# Patient Record
Sex: Female | Born: 1940 | Race: White | Hispanic: No | Marital: Married | Smoking: Never smoker
Health system: Southern US, Community
[De-identification: ages and names within clinical notes are randomized; demographics above are authoritative.]

## PROBLEM LIST (undated history)

## (undated) DIAGNOSIS — R071 Chest pain on breathing: Secondary | ICD-10-CM

## (undated) DIAGNOSIS — E785 Hyperlipidemia, unspecified: Secondary | ICD-10-CM

## (undated) DIAGNOSIS — I10 Essential (primary) hypertension: Secondary | ICD-10-CM

## (undated) DIAGNOSIS — N951 Menopausal and female climacteric states: Secondary | ICD-10-CM

## (undated) HISTORY — PX: NO PAST SURGERIES: SHX2092

## (undated) HISTORY — DX: Menopausal and female climacteric states: N95.1

## (undated) HISTORY — DX: Essential (primary) hypertension: I10

## (undated) HISTORY — DX: Chest pain on breathing: R07.1

## (undated) HISTORY — PX: COLONOSCOPY: SHX174

## (undated) HISTORY — DX: Hyperlipidemia, unspecified: E78.5

---

## 1998-08-15 ENCOUNTER — Encounter: Payer: Self-pay | Admitting: Family Medicine

## 1998-08-15 ENCOUNTER — Ambulatory Visit (HOSPITAL_COMMUNITY): Admission: RE | Admit: 1998-08-15 | Discharge: 1998-08-15 | Payer: Self-pay | Admitting: Family Medicine

## 1998-12-21 ENCOUNTER — Ambulatory Visit (HOSPITAL_COMMUNITY): Admission: RE | Admit: 1998-12-21 | Discharge: 1998-12-21 | Payer: Self-pay | Admitting: Family Medicine

## 2000-04-20 ENCOUNTER — Other Ambulatory Visit: Admission: RE | Admit: 2000-04-20 | Discharge: 2000-04-20 | Payer: Self-pay | Admitting: Family Medicine

## 2000-10-28 ENCOUNTER — Encounter: Payer: Self-pay | Admitting: Family Medicine

## 2000-10-28 ENCOUNTER — Ambulatory Visit (HOSPITAL_COMMUNITY): Admission: RE | Admit: 2000-10-28 | Discharge: 2000-10-28 | Payer: Self-pay | Admitting: Family Medicine

## 2001-01-03 ENCOUNTER — Emergency Department (HOSPITAL_COMMUNITY): Admission: EM | Admit: 2001-01-03 | Discharge: 2001-01-03 | Payer: Self-pay

## 2001-10-01 ENCOUNTER — Other Ambulatory Visit: Admission: RE | Admit: 2001-10-01 | Discharge: 2001-10-01 | Payer: Self-pay | Admitting: Family Medicine

## 2001-10-05 ENCOUNTER — Encounter: Payer: Self-pay | Admitting: Internal Medicine

## 2001-11-23 ENCOUNTER — Other Ambulatory Visit: Admission: RE | Admit: 2001-11-23 | Discharge: 2001-11-23 | Payer: Self-pay | Admitting: Family Medicine

## 2003-06-29 ENCOUNTER — Other Ambulatory Visit: Admission: RE | Admit: 2003-06-29 | Discharge: 2003-06-29 | Payer: Self-pay | Admitting: Family Medicine

## 2003-07-05 ENCOUNTER — Encounter: Payer: Self-pay | Admitting: Family Medicine

## 2003-07-05 ENCOUNTER — Encounter: Admission: RE | Admit: 2003-07-05 | Discharge: 2003-07-05 | Payer: Self-pay | Admitting: Family Medicine

## 2004-07-09 ENCOUNTER — Encounter: Payer: Self-pay | Admitting: Internal Medicine

## 2004-07-09 ENCOUNTER — Other Ambulatory Visit: Admission: RE | Admit: 2004-07-09 | Discharge: 2004-07-09 | Payer: Self-pay | Admitting: Family Medicine

## 2005-01-07 ENCOUNTER — Ambulatory Visit (HOSPITAL_COMMUNITY): Admission: RE | Admit: 2005-01-07 | Discharge: 2005-01-07 | Payer: Self-pay | Admitting: Family Medicine

## 2006-01-13 ENCOUNTER — Ambulatory Visit (HOSPITAL_COMMUNITY): Admission: RE | Admit: 2006-01-13 | Discharge: 2006-01-13 | Payer: Self-pay | Admitting: Internal Medicine

## 2006-01-13 ENCOUNTER — Encounter: Payer: Self-pay | Admitting: Internal Medicine

## 2006-02-10 ENCOUNTER — Ambulatory Visit: Payer: Self-pay | Admitting: Internal Medicine

## 2006-02-16 ENCOUNTER — Encounter: Payer: Self-pay | Admitting: Internal Medicine

## 2006-02-17 ENCOUNTER — Ambulatory Visit: Payer: Self-pay | Admitting: Internal Medicine

## 2006-03-17 ENCOUNTER — Ambulatory Visit: Payer: Self-pay | Admitting: Internal Medicine

## 2006-04-01 ENCOUNTER — Ambulatory Visit: Payer: Self-pay | Admitting: Internal Medicine

## 2006-04-06 ENCOUNTER — Ambulatory Visit: Payer: Self-pay | Admitting: Internal Medicine

## 2007-01-15 ENCOUNTER — Encounter: Payer: Self-pay | Admitting: Internal Medicine

## 2007-01-15 ENCOUNTER — Ambulatory Visit (HOSPITAL_COMMUNITY): Admission: RE | Admit: 2007-01-15 | Discharge: 2007-01-15 | Payer: Self-pay | Admitting: Internal Medicine

## 2007-02-15 ENCOUNTER — Ambulatory Visit: Payer: Self-pay | Admitting: Internal Medicine

## 2007-02-15 LAB — CONVERTED CEMR LAB
ALT: 17 units/L (ref 0–40)
BUN: 16 mg/dL (ref 6–23)
Bilirubin, Direct: 0.1 mg/dL (ref 0.0–0.3)
CO2: 26 meq/L (ref 19–32)
Chloride: 109 meq/L (ref 96–112)
Creatinine, Ser: 0.8 mg/dL (ref 0.4–1.2)
Direct LDL: 159.1 mg/dL
Eosinophils Absolute: 0.1 10*3/uL (ref 0.0–0.6)
GFR calc Af Amer: 92 mL/min
GFR calc non Af Amer: 76 mL/min
HCT: 43.8 % (ref 36.0–46.0)
HDL: 44.9 mg/dL (ref >39.0)
Hemoglobin: 14.8 g/dL (ref 12.0–15.0)
MCHC: 33.8 g/dL (ref 30.0–36.0)
MCV: 91.9 fL (ref 78.0–100.0)
Neutro Abs: 3.3 10*3/uL (ref 1.4–7.7)
Neutrophils Relative %: 57.6 % (ref 43.0–77.0)
Platelets: 267 10*3/uL (ref 150–400)
Potassium: 4.3 meq/L (ref 3.5–5.1)
RDW: 12.3 % (ref 11.5–14.6)
Sodium: 142 meq/L (ref 135–145)
TSH: 1.39 microintl units/mL (ref 0.35–5.50)
Total CHOL/HDL Ratio: 5.2
Total Protein: 7.1 g/dL (ref 6.0–8.3)
Triglycerides: 160 mg/dL — ABNORMAL HIGH (ref 0–149)
WBC: 5.6 10*3/uL (ref 4.5–10.5)

## 2007-02-19 ENCOUNTER — Ambulatory Visit: Payer: Self-pay | Admitting: Internal Medicine

## 2007-03-24 ENCOUNTER — Ambulatory Visit: Payer: Self-pay | Admitting: Internal Medicine

## 2007-07-28 DIAGNOSIS — I10 Essential (primary) hypertension: Secondary | ICD-10-CM

## 2007-07-28 HISTORY — DX: Essential (primary) hypertension: I10

## 2007-08-16 ENCOUNTER — Ambulatory Visit: Payer: Self-pay | Admitting: Internal Medicine

## 2007-08-27 ENCOUNTER — Ambulatory Visit: Payer: Self-pay | Admitting: Internal Medicine

## 2007-12-12 ENCOUNTER — Emergency Department (HOSPITAL_COMMUNITY): Admission: EM | Admit: 2007-12-12 | Discharge: 2007-12-12 | Payer: Self-pay | Admitting: Emergency Medicine

## 2007-12-13 ENCOUNTER — Ambulatory Visit: Payer: Self-pay | Admitting: Internal Medicine

## 2007-12-13 DIAGNOSIS — H6500 Acute serous otitis media, unspecified ear: Secondary | ICD-10-CM | POA: Insufficient documentation

## 2008-02-04 ENCOUNTER — Ambulatory Visit (HOSPITAL_COMMUNITY): Admission: RE | Admit: 2008-02-04 | Discharge: 2008-02-04 | Payer: Self-pay | Admitting: Internal Medicine

## 2008-02-23 ENCOUNTER — Telehealth: Payer: Self-pay | Admitting: Internal Medicine

## 2008-03-13 ENCOUNTER — Ambulatory Visit: Payer: Self-pay | Admitting: Internal Medicine

## 2008-05-25 ENCOUNTER — Ambulatory Visit: Payer: Self-pay | Admitting: Internal Medicine

## 2008-05-25 LAB — CONVERTED CEMR LAB
AST: 24 units/L (ref 0–37)
Albumin: 4.1 g/dL (ref 3.5–5.2)
Basophils Absolute: 0 10*3/uL (ref 0.0–0.1)
Basophils Relative: 0.1 % (ref 0.0–1.0)
Bilirubin, Direct: 0.1 mg/dL (ref 0.0–0.3)
CO2: 29 meq/L (ref 19–32)
Calcium: 10.1 mg/dL (ref 8.4–10.5)
Chloride: 104 meq/L (ref 96–112)
Cholesterol: 226 mg/dL (ref 0–200)
Creatinine, Ser: 0.8 mg/dL (ref 0.4–1.2)
GFR calc non Af Amer: 76 mL/min
Glucose, Bld: 89 mg/dL (ref 70–99)
Glucose, Urine, Semiquant: NEGATIVE
Lymphocytes Relative: 26.4 % (ref 12.0–46.0)
MCHC: 35.6 g/dL (ref 30.0–36.0)
MCV: 92.8 fL (ref 78.0–100.0)
Platelets: 245 10*3/uL (ref 150–400)
Protein, U semiquant: NEGATIVE
Sodium: 141 meq/L (ref 135–145)
TSH: 1.18 microintl units/mL (ref 0.35–5.50)
Total Bilirubin: 1.1 mg/dL (ref 0.3–1.2)
Total Protein: 7.7 g/dL (ref 6.0–8.3)
pH: 6

## 2008-05-31 ENCOUNTER — Encounter: Payer: Self-pay | Admitting: Internal Medicine

## 2008-06-01 ENCOUNTER — Ambulatory Visit: Payer: Self-pay | Admitting: Internal Medicine

## 2008-06-01 ENCOUNTER — Encounter: Payer: Self-pay | Admitting: Internal Medicine

## 2008-06-01 ENCOUNTER — Other Ambulatory Visit: Admission: RE | Admit: 2008-06-01 | Discharge: 2008-06-01 | Payer: Self-pay | Admitting: Internal Medicine

## 2008-06-01 DIAGNOSIS — N951 Menopausal and female climacteric states: Secondary | ICD-10-CM

## 2008-06-01 DIAGNOSIS — E785 Hyperlipidemia, unspecified: Secondary | ICD-10-CM

## 2008-06-01 DIAGNOSIS — E782 Mixed hyperlipidemia: Secondary | ICD-10-CM

## 2008-06-01 HISTORY — DX: Hyperlipidemia, unspecified: E78.5

## 2008-06-01 HISTORY — DX: Menopausal and female climacteric states: N95.1

## 2008-07-26 ENCOUNTER — Ambulatory Visit: Payer: Self-pay | Admitting: Internal Medicine

## 2008-07-26 DIAGNOSIS — R071 Chest pain on breathing: Secondary | ICD-10-CM

## 2008-07-26 HISTORY — DX: Chest pain on breathing: R07.1

## 2008-09-18 ENCOUNTER — Telehealth: Payer: Self-pay | Admitting: Internal Medicine

## 2009-03-22 ENCOUNTER — Ambulatory Visit (HOSPITAL_COMMUNITY): Admission: RE | Admit: 2009-03-22 | Discharge: 2009-03-22 | Payer: Self-pay | Admitting: Internal Medicine

## 2009-07-02 ENCOUNTER — Ambulatory Visit: Payer: Self-pay | Admitting: Internal Medicine

## 2009-07-02 LAB — CONVERTED CEMR LAB
ALT: 27 units/L (ref 0–35)
Alkaline Phosphatase: 74 units/L (ref 39–117)
BUN: 16 mg/dL (ref 6–23)
Basophils Absolute: 0 10*3/uL (ref 0.0–0.1)
Bilirubin Urine: NEGATIVE
Bilirubin, Direct: 0.2 mg/dL (ref 0.0–0.3)
Chloride: 107 meq/L (ref 96–112)
Cholesterol: 220 mg/dL — ABNORMAL HIGH (ref 0–200)
Creatinine, Ser: 0.8 mg/dL (ref 0.4–1.2)
Direct LDL: 150.9 mg/dL
GFR calc non Af Amer: 75.59 mL/min (ref >60)
Glucose, Bld: 93 mg/dL (ref 70–99)
Hemoglobin: 15 g/dL (ref 12.0–15.0)
Lymphocytes Relative: 35.4 % (ref 12.0–46.0)
MCHC: 33.9 g/dL (ref 30.0–36.0)
Monocytes Absolute: 0.7 10*3/uL (ref 0.1–1.0)
Neutrophils Relative %: 53.2 % (ref 43.0–77.0)
Platelets: 232 10*3/uL (ref 150.0–400.0)
RBC: 4.77 M/uL (ref 3.87–5.11)
Total CHOL/HDL Ratio: 5
Total Protein, Urine: NEGATIVE mg/dL
Triglycerides: 176 mg/dL — ABNORMAL HIGH (ref 0.0–149.0)
Urine Glucose: NEGATIVE mg/dL
Urobilinogen, UA: 0.2 (ref 0.0–1.0)
VLDL: 35.2 mg/dL (ref 0.0–40.0)
pH: 6 (ref 5.0–8.0)

## 2009-07-06 ENCOUNTER — Ambulatory Visit: Payer: Self-pay | Admitting: Internal Medicine

## 2010-04-02 ENCOUNTER — Ambulatory Visit: Payer: Self-pay | Admitting: Internal Medicine

## 2010-04-02 DIAGNOSIS — IMO0002 Reserved for concepts with insufficient information to code with codable children: Secondary | ICD-10-CM

## 2010-04-05 ENCOUNTER — Ambulatory Visit (HOSPITAL_COMMUNITY): Admission: RE | Admit: 2010-04-05 | Discharge: 2010-04-05 | Payer: Self-pay | Admitting: Internal Medicine

## 2010-04-05 LAB — HM MAMMOGRAPHY: HM Mammogram: NEGATIVE

## 2010-07-08 ENCOUNTER — Ambulatory Visit: Payer: Self-pay | Admitting: Internal Medicine

## 2010-07-08 LAB — CONVERTED CEMR LAB
ALT: 32 units/L (ref 0–35)
BUN: 17 mg/dL (ref 6–23)
Basophils Absolute: 0 10*3/uL (ref 0.0–0.1)
Bilirubin Urine: NEGATIVE
Bilirubin, Direct: 0.1 mg/dL (ref 0.0–0.3)
CO2: 25 meq/L (ref 19–32)
Calcium: 10.2 mg/dL (ref 8.4–10.5)
Cholesterol: 220 mg/dL — ABNORMAL HIGH (ref 0–200)
Creatinine, Ser: 0.8 mg/dL (ref 0.4–1.2)
Direct LDL: 139.9 mg/dL
Eosinophils Relative: 1.6 % (ref 0.0–5.0)
GFR calc non Af Amer: 74.29 mL/min (ref >60)
HCT: 43.3 % (ref 36.0–46.0)
Hemoglobin: 15 g/dL (ref 12.0–15.0)
Lymphocytes Relative: 28.1 % (ref 12.0–46.0)
MCHC: 34.7 g/dL (ref 30.0–36.0)
Neutro Abs: 4.1 10*3/uL (ref 1.4–7.7)
Nitrite: NEGATIVE
Potassium: 3.8 meq/L (ref 3.5–5.1)
Protein, U semiquant: NEGATIVE
RBC: 4.59 M/uL (ref 3.87–5.11)
RDW: 13.1 % (ref 11.5–14.6)
TSH: 1.34 microintl units/mL (ref 0.35–5.50)
Total Bilirubin: 0.7 mg/dL (ref 0.3–1.2)
Total Protein: 6.9 g/dL (ref 6.0–8.3)
Triglycerides: 245 mg/dL — ABNORMAL HIGH (ref 0.0–149.0)
Urobilinogen, UA: 0.2
WBC: 6.8 10*3/uL (ref 4.5–10.5)
pH: 7.5

## 2010-07-11 ENCOUNTER — Ambulatory Visit: Payer: Self-pay | Admitting: Internal Medicine

## 2010-07-12 ENCOUNTER — Encounter: Payer: Self-pay | Admitting: Internal Medicine

## 2010-07-12 ENCOUNTER — Ambulatory Visit: Payer: Self-pay | Admitting: Internal Medicine

## 2010-11-01 ENCOUNTER — Ambulatory Visit: Payer: Self-pay | Admitting: Internal Medicine

## 2010-11-01 DIAGNOSIS — M7500 Adhesive capsulitis of unspecified shoulder: Secondary | ICD-10-CM

## 2010-11-05 ENCOUNTER — Encounter: Payer: Self-pay | Admitting: Internal Medicine

## 2010-12-19 NOTE — Miscellaneous (Signed)
Summary: Waiver of Liability for [employer]  Waiver of Liability for [employer]   Imported By: Maryln Gottron 07/17/2010 11:26:00  _____________________________________________________________________  External Attachment:    Type:   Image     Comment:   External Document

## 2010-12-19 NOTE — Assessment & Plan Note (Signed)
Summary: consult re: leg pain for approx 3 wks/cjr   Vital Signs:  Patient profile:   70 year old female Weight:      144 pounds Temp:     98.0 degrees F oral BP sitting:   108 / 60  (left arm) Cuff size:   regular  Vitals Entered By: Duard Brady LPN (Apr 02, 2010 11:28 AM) CC: c/o (R) groin pain x3 wks - started water aerobics 3 wks ago Is Patient Diabetic? No   CC:  c/o (R) groin pain x3 wks - started water aerobics 3 wks ago.  History of Present Illness: 70 year old patient who presents with a 3-week history of pain in the right groin area.  She has been quite active with golf exercise regimen and water aerobics while she was at the beach.  Pain is aggravated by certain movements that involved external rotation of the hip.  There's been no history of trauma.  There is been no local tenderness or concerns about a hernia  Preventive Screening-Counseling & Management  Alcohol-Tobacco     Smoking Status: never  Allergies (verified): No Known Drug Allergies  Past History:  Past Medical History: Reviewed history from 06/01/2008 and no changes required. Hypertension gravida 3, para 3, abortus zero Hyperlipidemia menopausal syndrome  Physical Exam  General:  Well-developed,well-nourished,in no acute distress; alert,appropriate and cooperative throughout examination; low-normal blood pressure Msk:  no tenderness to palpation in the right groin region.  Straight leg testing was negative.  Flexion of the hip revealed no discomfort, but external rotation reproduced her pain   Impression & Recommendations:  Problem # 1:  GROIN STRAIN, RIGHT (ICD-848.8)  Problem # 2:  HYPERTENSION (ICD-401.9)  Her updated medication list for this problem includes:    Hydrochlorothiazide 25 Mg Tabs (Hydrochlorothiazide) .Marland Kitchen... Take 1 tablet by mouth once a day  Her updated medication list for this problem includes:    Hydrochlorothiazide 25 Mg Tabs (Hydrochlorothiazide) .Marland Kitchen... Take 1  tablet by mouth once a day  Complete Medication List: 1)  Aspirin 81 Mg Tbec (Aspirin) .... Take 1 tablet by mouth every morning 2)  Hydrochlorothiazide 25 Mg Tabs (Hydrochlorothiazide) .... Take 1 tablet by mouth once a day 3)  Multivitamins Tabs (Multiple vitamin) .... Once daily 4)  Caltrate 600+d 600-400 Mg-unit Tabs (Calcium carbonate-vitamin d) .... Qd  Patient Instructions: 1)  You may move around but avoid painful motions. Apply ice to sore area for 20 minutes 3-4 times a day for 2-3 days. 2)  Take 400-600mg  of Ibuprofen (Advil, Motrin) with food every 4-6 hours as needed for relief of pain or comfort of fever.

## 2010-12-19 NOTE — Miscellaneous (Signed)
Summary: BONE DENSITY  Clinical Lists Changes  Orders: Added new Test order of T-Bone Densitometry (77080) - Signed Added new Test order of T-Lumbar Vertebral Assessment (77082) - Signed 

## 2010-12-19 NOTE — Assessment & Plan Note (Signed)
Summary: CPX // RS---PT [facility] // RS   Vital Signs:  Patient profile:   70 year old female Menstrual status:  postmenopausal Height:      62.75 inches Weight:      124 pounds BMI:     22.22 Temp:     98.1 degrees F oral BP sitting:   120 / 90  (left arm) Cuff size:   regular  Vitals Entered By: Kathrynn Speed CMA (July 11, 2010 1:44 PM) CC: cpx, review labs,src Is Patient Diabetic? No     Menstrual Status postmenopausal   CC:  cpx, review labs, and src.  History of Present Illness: 57 -year-old who is in today for a wellness exam.  She has a history of treated hypertension.  Complains include right hip and right shoulder pain.  She denies any cardiopulmonary complaints.  She remains quite active with walking and golf, but at times uncomfortable  Here for Medicare AWV:  1.   Risk factors based on Past M, S, F history:cardiovascular risk factors include a history of hypertension 2.   Physical Activities: remains quite active with walking daily and also a golf 3.   Depression/mood: no history of depression or mood disorder 4.   Hearing: no deficits 5.   ADL's: independent in all aspects of daily living 6.   Fall Risk: low 7.   Home Safety: no problems identified 8.   Height, weight, &visual acuity:height and weight stable.  No prior bone density study.  No difficulty with visual acuity 9.   Counseling: heart healthy diet.  Discussed 10.   Labs ordered based on risk factors: laboratory studies will be reviewed, including lipid profile 11.           Referral Coordination- may require orthopedic referral if symptoms refractory to short-term anti-inflammatory medication 12.           Care Plan- bone density study ordered 13.            Cognitive Assessment- alert and oriented, with normal affect and memory dysfunction, able to perform all executive functions.  No functional limitations   Current Medications (verified): 1)  Aspirin 81 Mg Tbec (Aspirin) .... Take 1 Tablet By Mouth  Every Morning 2)  Hydrochlorothiazide 25 Mg Tabs (Hydrochlorothiazide) .... Take 1 Tablet By Mouth Once A Day 3)  Multivitamins   Tabs (Multiple Vitamin) .... Once Daily 4)  Caltrate 600+d 600-400 Mg-Unit Tabs (Calcium Carbonate-Vitamin D) .... Qd  Allergies (verified): No Known Drug Allergies  Past History:  Past Medical History: Reviewed history from 06/01/2008 and no changes required. Hypertension gravida 3, para 3, abortus zero Hyperlipidemia menopausal syndrome  Past Surgical History: Reviewed history from 03/13/2008 and no changes required. colonoscopy  May 2007  Family History: Reviewed history from 07/06/2009 and no changes required. father died age 22, heart failure mother died age 13, lung cancer one brother is well    Social History: Reviewed history from 06/01/2008 and no changes required. Married Never Smoked Regular exercise-yes  Review of Systems  The patient denies anorexia, fever, weight loss, weight gain, vision loss, decreased hearing, hoarseness, chest pain, syncope, dyspnea on exertion, peripheral edema, prolonged cough, headaches, hemoptysis, abdominal pain, melena, hematochezia, severe indigestion/heartburn, hematuria, incontinence, genital sores, muscle weakness, suspicious skin lesions, transient blindness, difficulty walking, depression, unusual weight change, abnormal bleeding, enlarged lymph nodes, angioedema, and breast masses.    Physical Exam  General:  Well-developed,well-nourished,in no acute distress; alert,appropriate and cooperative throughout examination Head:  Normocephalic and atraumatic without  obvious abnormalities. No apparent alopecia or balding. Eyes:  No corneal or conjunctival inflammation noted. EOMI. Perrla. Funduscopic exam benign, without hemorrhages, exudates or papilledema. Vision grossly normal. Ears:  External ear exam shows no significant lesions or deformities.  Otoscopic examination reveals clear canals, tympanic  membranes are intact bilaterally without bulging, retraction, inflammation or discharge. Hearing is grossly normal bilaterally. Nose:  External nasal examination shows no deformity or inflammation. Nasal mucosa are pink and moist without lesions or exudates. Mouth:  Oral mucosa and oropharynx without lesions or exudates.  Teeth in good repair. Neck:  No deformities, masses, or tenderness noted. Chest Wall:  No deformities, masses, or tenderness noted. Breasts:  No mass, nodules, thickening, tenderness, bulging, retraction, inflamation, nipple discharge or skin changes noted.   Lungs:  Normal respiratory effort, chest expands symmetrically. Lungs are clear to auscultation, no crackles or wheezes. Heart:  Normal rate and regular rhythm. S1 and S2 normal without gallop, murmur, click, rub or other extra sounds. Abdomen:  Bowel sounds positive,abdomen soft and non-tender without masses, organomegaly or hernias noted. Rectal:  No external abnormalities noted. Normal sphincter tone. No rectal masses or tenderness. Genitalia:  Normal introitus for age, no external lesions, no vaginal discharge, mucosa pink and moist, no vaginal or cervical lesions, no vaginal atrophy, no friaility or hemorrhage, normal uterus size and position, no adnexal masses or tenderness Msk:  No deformity or scoliosis noted of thoracic or lumbar spine.   Pulses:  R and L carotid,radial,femoral,dorsalis pedis and posterior tibial pulses are full and equal bilaterally Extremities:  No clubbing, cyanosis, edema, or deformity noted with normal full range of motion of all joints.   Neurologic:  No cranial nerve deficits noted. Station and gait are normal. Plantar reflexes are down-going bilaterally. DTRs are symmetrical throughout. Sensory, motor and coordinative functions appear intact. Skin:  Intact without suspicious lesions or rashes Cervical Nodes:  No lymphadenopathy noted Axillary Nodes:  No palpable lymphadenopathy Inguinal  Nodes:  No significant adenopathy Psych:  Cognition and judgment appear intact. Alert and cooperative with normal attention span and concentration. No apparent delusions, illusions, hallucinations   Impression & Recommendations:  Problem # 1:  PHYSICAL EXAMINATION (ICD-V70.0)  Orders: Medicare -1st Annual Wellness Visit [phone])  Complete Medication List: 1)  Aspirin 81 Mg Tbec (Aspirin) .... Take 1 tablet by mouth every morning 2)  Hydrochlorothiazide 25 Mg Tabs (Hydrochlorothiazide) .... Take 1 tablet by mouth once a day 3)  Multivitamins Tabs (Multiple vitamin) .... Once daily 4)  Caltrate 600+d 600-400 Mg-unit Tabs (Calcium carbonate-vitamin d) .... Qd  Other Orders: Zoster (Shingles) Vaccine Live [phone]) Admin 1st Vaccine (69629) Flu Vaccine 81yrs + (52841)  Patient Instructions: 1)  Please schedule a follow-up appointment in 1 year. 2)  Limit your Sodium (Salt) to less than 2 grams a day(slightly less than 1/2 a teaspoon) to prevent fluid retention, swelling, or worsening of symptoms. 3)  It is important that you exercise regularly at least 20 minutes 5 times a week. If you develop chest pain, have severe difficulty breathing, or feel very tired , stop exercising immediately and seek medical attention. 4)  Take calcium +Vitamin D daily. 5)  Take 400-600mg  of Ibuprofen (Advil, Motrin) with food every 4-6 hours as needed for relief of pain or comfort of fever. 6)  Schedule Bone Density Prescriptions: HYDROCHLOROTHIAZIDE 25 MG TABS (HYDROCHLOROTHIAZIDE) Take 1 tablet by mouth once a day  #90 x 6   Entered and Authorized by:   Gordy Savers  MD  Signed by:   Gordy Savers  MD on 07/11/2010   Method used:   Electronically to        [facility]  [phone]* (retail)       [address] [place], [place]  [address]       Ph: [phone] or [phone]       Fax: [phone]   RxID:   [id number]    Immunizations Administered:  Zostavax # 1:     Vaccine Type: Zostavax    Site: left deltoid    Mfr: Merck    Dose: 0.5 ml    Route: Rice    Given by: Duard Brady LPN    Exp. Date: 07/05/2011    Lot #: [id number]    VIS given: 08/29/05 given July 11, 2010.    Physician counseled: yes Flu Vaccine Consent Questions     Do you have a history of severe allergic reactions to this vaccine? no    Any prior history of allergic reactions to egg and/or gelatin? no    Do you have a sensitivity to the preservative Thimersol? no    Do you have a past history of Guillan-Barre Syndrome? no    Do you currently have an acute febrile illness? no    Have you ever had a severe reaction to latex? no    Vaccine information given and explained to patient? yes    Are you currently pregnant? no    Lot Number:[id number]   Exp Date:05/17/2011   Site Given  Left Deltoid IM    VIS given: 08/29/05 given July 11, 2010.    Physician counseled: yes .lbflu

## 2010-12-19 NOTE — Assessment & Plan Note (Signed)
Summary: arm pain/njr   Vital Signs:  Patient profile:   70 year old female Menstrual status:  postmenopausal Weight:      146 pounds Temp:     98.1 degrees F oral BP sitting:   110 / 80  (left arm) Cuff size:   regular  Vitals Entered By: Duard Brady LPN (November 01, 2010 8:10 AM) CC: rt arm and leg pain Is Patient Diabetic? No   CC:  rt arm and leg pain.  History of Present Illness:  70 year old patient, who presents with a two to 3 month history of  worsening right shoulder pain.  The pain is aggravated by movement.  She is also having some right hip discomfort.  is a difficult time reaching the behind her back and raising her right arm past 90 degrees abduction.  No trauma.  She has a history of hypertension, which has been well controlled on diuretic therapy;  remains quite active with exercise program  Allergies (verified): No Known Drug Allergies  Past History:  Past Medical History: Reviewed history from 06/01/2008 and no changes required. Hypertension gravida 3, para 3, abortus zero Hyperlipidemia menopausal syndrome  Review of Systems       The patient complains of difficulty walking.  The patient denies anorexia, fever, weight loss, weight gain, vision loss, decreased hearing, hoarseness, chest pain, syncope, dyspnea on exertion, peripheral edema, prolonged cough, headaches, hemoptysis, abdominal pain, melena, hematochezia, severe indigestion/heartburn, hematuria, incontinence, genital sores, muscle weakness, suspicious skin lesions, transient blindness, depression, unusual weight change, abnormal bleeding, enlarged lymph nodes, angioedema, and breast masses.    Physical Exam  General:  Well-developed,well-nourished,in no acute distress; alert,appropriate and cooperative throughout examination; low-pressure well-controlled Msk:  range of motion.  The right shoulder, limited, and painful; internal and external rotation of the right  hip also  painful   Impression & Recommendations:  Problem # 1:  ADHESIVE CAPSULITIS, RIGHT (ICD-726.0)  Orders: Orthopedic Referral (Ortho)  Problem # 2:  HYPERTENSION (ICD-401.9)  Her updated medication list for this problem includes:    Hydrochlorothiazide 25 Mg Tabs (Hydrochlorothiazide) .Marland Kitchen... Take 1 tablet by mouth once a day  Her updated medication list for this problem includes:    Hydrochlorothiazide 25 Mg Tabs (Hydrochlorothiazide) .Marland Kitchen... Take 1 tablet by mouth once a day  Complete Medication List: 1)  Aspirin 81 Mg Tbec (Aspirin) .... Take 1 tablet by mouth every morning 2)  Hydrochlorothiazide 25 Mg Tabs (Hydrochlorothiazide) .... Take 1 tablet by mouth once a day 3)  Multivitamins Tabs (Multiple vitamin) .... Once daily 4)  Caltrate 600+d 600-400 Mg-unit Tabs (Calcium carbonate-vitamin d) .... Qd  Patient Instructions: 1)  Limit your Sodium (Salt) to less than 2 grams a day(slightly less than 1/2 a teaspoon) to prevent fluid retention, swelling, or worsening of symptoms. 2)  It is important that you exercise regularly at least 20 minutes 5 times a week. If you develop chest pain, have severe difficulty breathing, or feel very tired , stop exercising immediately and seek medical attention. 3)  orthopedic evaluation as scheduled 4)  annual  exam in August   Orders Added: 1)  Est. Patient Level III [57846] 2)  Orthopedic Referral [Ortho]

## 2010-12-19 NOTE — Consult Note (Signed)
Summary: [facility]   Imported By: Maryln Gottron 11/19/2010 08:51:28  _____________________________________________________________________  External Attachment:    Type:   Image     Comment:   External Document

## 2011-04-04 NOTE — Assessment & Plan Note (Signed)
[facility] OFFICE NOTE   Kayla [name], Kayla [name]                       MRN:          [id number]  DATE:02/19/2007                            DOB:          [birth year]    A 70 year old female seen today for an annual exam.  She enjoys  excellent health.  She has had some borderline cholesterol readings.  She is also a hypertensive suspect.  She has not been checking her home  blood pressures recently.  She takes no chronic medications.  She had a  recent mammogram and last year had colonoscopy.   FAMILY HISTORY:  Unchanged, 1 brother remains well.   EXAM:  Reveals a well-developed healthy-appearing female who appears  younger than her stated age.  Blood pressure is 140/100.  Fundi, ear, nose, and throat clear.  NECK:  No bruits or adenopathy.  No thyroid enlargement.  CHEST:  Clear.  BREASTS:  Negative.  CARDIOVASCULAR:  Normal heart sounds, no murmurs.  ABDOMEN:  Benign.  Mildly overweight.  PELVIC:  Normal cervix.  No adnexal masses.  RECTAL:  Deferred.  EXTREMITIES:  Full peripheral pulses.  No edema.  NEURO:  Negative.   IMPRESSION:  Rule out hypertension.   DISPOSITION:  Will ask the patient to track her blood pressures  carefully over the next couple weeks and consider antihypertensive  therapy at that time.  Will continue her efforts at exercise, diet, and  weight loss.     Gordy Savers, MD  Electronically Signed    PFK/MedQ  DD: 02/19/2007  DT: 02/19/2007  Job #: [id number]

## 2011-04-28 ENCOUNTER — Other Ambulatory Visit: Payer: Self-pay | Admitting: Internal Medicine

## 2011-04-28 DIAGNOSIS — Z1231 Encounter for screening mammogram for malignant neoplasm of breast: Secondary | ICD-10-CM

## 2011-05-05 ENCOUNTER — Ambulatory Visit (HOSPITAL_COMMUNITY)
Admission: RE | Admit: 2011-05-05 | Discharge: 2011-05-05 | Disposition: A | Discharge status: Home / Self Care | Payer: Medicare Other | Source: Ambulatory Visit | Attending: Internal Medicine | Admitting: Internal Medicine

## 2011-05-05 DIAGNOSIS — Z1231 Encounter for screening mammogram for malignant neoplasm of breast: Secondary | ICD-10-CM | POA: Insufficient documentation

## 2011-07-14 ENCOUNTER — Other Ambulatory Visit: Payer: Self-pay | Admitting: Internal Medicine

## 2011-07-23 ENCOUNTER — Encounter: Payer: Self-pay | Admitting: Internal Medicine

## 2011-07-24 ENCOUNTER — Telehealth: Payer: Self-pay | Admitting: Internal Medicine

## 2011-07-24 ENCOUNTER — Ambulatory Visit (INDEPENDENT_AMBULATORY_CARE_PROVIDER_SITE_OTHER): Payer: Medicare Other | Admitting: Internal Medicine

## 2011-07-24 ENCOUNTER — Encounter: Payer: Self-pay | Admitting: Internal Medicine

## 2011-07-24 VITALS — BP 112/80 | HR 64 | Temp 97.4°F | Resp 16 | Ht 63.0 in | Wt 146.0 lb

## 2011-07-24 DIAGNOSIS — Z23 Encounter for immunization: Secondary | ICD-10-CM

## 2011-07-24 DIAGNOSIS — I10 Essential (primary) hypertension: Secondary | ICD-10-CM

## 2011-07-24 DIAGNOSIS — Z0000 Encounter for general adult medical examination without abnormal findings: Secondary | ICD-10-CM

## 2011-07-24 MED ORDER — HYDROCHLOROTHIAZIDE 25 MG PO TABS: 25.0000 mg | ORAL_TABLET | Freq: Every day | ORAL | Status: DC

## 2011-07-24 MED ORDER — INFLUENZA VAC TYP A&B SURF ANT IM INJ
0.5000 mL | INJECTION | Freq: Once | INTRAMUSCULAR | Status: AC
  Administered 2011-07-24: 0.5 mL via INTRAMUSCULAR

## 2011-07-24 NOTE — Patient Instructions (Signed)
It is important that you exercise regularly, at least 20 minutes 3 to 4 times per week.  If you develop chest pain or shortness of breath seek  medical attention.  Limit your sodium (Salt) intake  Please check your blood pressure on a regular basis.  If it is consistently greater than 150/90, please make an office appointment.  Return in one year for follow-up  Take a calcium supplement, plus [phone] units of vitamin D

## 2011-07-24 NOTE — Telephone Encounter (Signed)
Spoke with pt - it was given 07/11/10

## 2011-07-24 NOTE — Progress Notes (Signed)
Subjective:    Patient ID: Kayla [name], female    DOB: [birth year], 70 y.o.   MRN: [id number]  HPI  History of Present Illness:   69 year-old who is in today for a wellness exam. She has a history of treated hypertension. Complains include right hip and right shoulder pain. She denies any cardiopulmonary complaints. She remains quite active with walking and golf, but at times uncomfortable   Here for Medicare AWV:   1. Risk factors based on Past M, S, F history:cardiovascular risk factors include a history of hypertension  2. Physical Activities: remains quite active with walking daily and also a golf  3. Depression/mood: no history of depression or mood disorder  4. Hearing: no deficits  5. ADL's: independent in all aspects of daily living  6. Fall Risk: low  7. Home Safety: no problems identified  8. Height, weight, &visual acuity:height and weight stable. No prior bone density study. No difficulty with visual acuity  9. Counseling: heart healthy diet. Discussed  10. Labs ordered based on risk factors: laboratory studies will be reviewed, including lipid profile  11. Referral Coordination- may require orthopedic referral if symptoms refractory to short-term anti-inflammatory medication  12. Care Plan- bone density study ordered  13. Cognitive Assessment- alert and oriented, with normal affect and memory dysfunction, able to perform all executive functions. No functional limitations   Current Medications (verified):  1) Aspirin 81 Mg Tbec (Aspirin) .... Take 1 Tablet By Mouth Every Morning  2) Hydrochlorothiazide 25 Mg Tabs (Hydrochlorothiazide) .... Take 1 Tablet By Mouth Once A Day  3) Multivitamins Tabs (Multiple Vitamin) .... Once Daily  4) Caltrate 600+d 600-400 Mg-Unit Tabs (Calcium Carbonate-Vitamin D) .... Qd   Allergies (verified):  No Known Drug Allergies   Past History:  Past Medical History:  Reviewed history from 06/01/2008 and no changes required.  Hypertension    gravida 3, para 3, abortus zero  Hyperlipidemia  menopausal syndrome   Past Surgical History:  Reviewed history from 03/13/2008 and no changes required.  colonoscopy May 2007   Family History:  Reviewed history from 07/06/2009 and no changes required.  father died age 38, heart failure  mother died age 35, lung cancer  one brother is well   Social History:  Reviewed history from 06/01/2008 and no changes required.  Married  Never Smoked  Regular exercise-yes      Review of Systems  Constitutional: Negative for fever, appetite change, fatigue and unexpected weight change.  HENT: Negative for hearing loss, ear pain, nosebleeds, congestion, sore throat, mouth sores, trouble swallowing, neck stiffness, dental problem, voice change, sinus pressure and tinnitus.   Eyes: Negative for photophobia, pain, redness and visual disturbance.  Respiratory: Negative for cough, chest tightness and shortness of breath.   Cardiovascular: Negative for chest pain, palpitations and leg swelling.  Gastrointestinal: Negative for nausea, vomiting, abdominal pain, diarrhea, constipation, blood in stool, abdominal distention and rectal pain.  Genitourinary: Negative for dysuria, urgency, frequency, hematuria, flank pain, vaginal bleeding, vaginal discharge, difficulty urinating, genital sores, vaginal pain, menstrual problem and pelvic pain.  Musculoskeletal: Negative for back pain and arthralgias.       Right hip pain. Right shoulder pain has improved greatly and is at 90%  Skin: Negative for rash.  Neurological: Negative for dizziness, syncope, speech difficulty, weakness, light-headedness, numbness and headaches.  Hematological: Negative for adenopathy. Does not bruise/bleed easily.  Psychiatric/Behavioral: Negative for suicidal ideas, behavioral problems, self-injury, dysphoric mood and agitation. The patient is not nervous/anxious.  Objective:   Physical Exam  Constitutional: She is  oriented to person, place, and time. She appears well-developed and well-nourished.  HENT:  Head: Normocephalic and atraumatic.  Right Ear: External ear normal.  Left Ear: External ear normal.  Mouth/Throat: Oropharynx is clear and moist.  Eyes: Conjunctivae and EOM are normal.  Neck: Normal range of motion. Neck supple. No JVD present. No thyromegaly present.  Cardiovascular: Normal rate, regular rhythm, normal heart sounds and intact distal pulses.   No murmur heard. Pulmonary/Chest: Effort normal and breath sounds normal. She has no wheezes. She has no rales.  Abdominal: Soft. Bowel sounds are normal. She exhibits no distension and no mass. There is no tenderness. There is no rebound and no guarding.  Musculoskeletal: Normal range of motion. She exhibits no edema and no tenderness.  Neurological: She is alert and oriented to person, place, and time. She has normal reflexes. No cranial nerve deficit. She exhibits normal muscle tone. Coordination normal.  Skin: Skin is warm and dry. No rash noted.  Psychiatric: She has a normal mood and affect. Her behavior is normal.          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 Patient wishes to continue when necessary Advil and try physical therapy at home. We'll consider x-rays in the future if symptoms worsen or aren't lifestyle limiting

## 2011-07-24 NOTE — Telephone Encounter (Signed)
Pt would like to know if she received a shingles shot last year. Please contact

## 2011-07-25 LAB — CBC WITH DIFFERENTIAL/PLATELET
Basophils Relative: 0.4 % (ref 0.0–3.0)
Eosinophils Relative: 1.2 % (ref 0.0–5.0)
HCT: 45.2 % (ref 36.0–46.0)
Hemoglobin: 15.1 g/dL — ABNORMAL HIGH (ref 12.0–15.0)
Monocytes Absolute: 0.6 10*3/uL (ref 0.1–1.0)
Monocytes Relative: 9 % (ref 3.0–12.0)
Neutro Abs: 3.9 10*3/uL (ref 1.4–7.7)
Platelets: 243 10*3/uL (ref 150.0–400.0)
RBC: 4.77 Mil/uL (ref 3.87–5.11)
RDW: 13.4 % (ref 11.5–14.6)
WBC: 6.3 10*3/uL (ref 4.5–10.5)

## 2011-07-25 LAB — LIPID PANEL
Cholesterol: 204 mg/dL — ABNORMAL HIGH (ref 0–200)
HDL: 50.2 mg/dL (ref >39.00)
Total CHOL/HDL Ratio: 4
Triglycerides: 238 mg/dL — ABNORMAL HIGH (ref 0.0–149.0)

## 2011-07-25 LAB — TSH: TSH: 1.08 u[IU]/mL (ref 0.35–5.50)

## 2011-07-25 LAB — HEPATIC FUNCTION PANEL
Bilirubin, Direct: 0 mg/dL (ref 0.0–0.3)
Total Bilirubin: 0.8 mg/dL (ref 0.3–1.2)

## 2011-07-25 LAB — BASIC METABOLIC PANEL
BUN: 20 mg/dL (ref 6–23)
Creatinine, Ser: 0.8 mg/dL (ref 0.4–1.2)
Potassium: 3.8 mEq/L (ref 3.5–5.1)

## 2011-07-25 LAB — LDL CHOLESTEROL, DIRECT: Direct LDL: 136.9 mg/dL

## 2011-07-31 ENCOUNTER — Telehealth: Payer: Self-pay | Admitting: Internal Medicine

## 2011-07-31 NOTE — Telephone Encounter (Signed)
Pt called req labs results. Pt also would like to get a copy mail to pts home address.

## 2011-07-31 NOTE — Telephone Encounter (Signed)
ok 

## 2011-07-31 NOTE — Telephone Encounter (Signed)
Please advise 

## 2011-08-01 NOTE — Telephone Encounter (Signed)
Chol [phone]  OK to mail copy of lab

## 2011-08-01 NOTE — Telephone Encounter (Signed)
Spoke with pt - informed of labs and sent copy to home address per request

## 2011-08-01 NOTE — Telephone Encounter (Signed)
Lipid panel results

## 2011-10-12 ENCOUNTER — Other Ambulatory Visit: Payer: Self-pay | Admitting: Internal Medicine

## 2012-06-15 ENCOUNTER — Other Ambulatory Visit: Payer: Self-pay | Admitting: Internal Medicine

## 2012-07-27 ENCOUNTER — Ambulatory Visit (INDEPENDENT_AMBULATORY_CARE_PROVIDER_SITE_OTHER): Payer: Medicare Other | Admitting: Internal Medicine

## 2012-07-27 ENCOUNTER — Encounter: Payer: Self-pay | Admitting: Internal Medicine

## 2012-07-27 VITALS — BP 126/80 | HR 68 | Temp 97.5°F | Resp 18 | Ht 62.75 in | Wt 148.0 lb

## 2012-07-27 DIAGNOSIS — Z23 Encounter for immunization: Secondary | ICD-10-CM

## 2012-07-27 DIAGNOSIS — E785 Hyperlipidemia, unspecified: Secondary | ICD-10-CM

## 2012-07-27 DIAGNOSIS — I10 Essential (primary) hypertension: Secondary | ICD-10-CM

## 2012-07-27 DIAGNOSIS — N951 Menopausal and female climacteric states: Secondary | ICD-10-CM

## 2012-07-27 DIAGNOSIS — M7500 Adhesive capsulitis of unspecified shoulder: Secondary | ICD-10-CM

## 2012-07-27 DIAGNOSIS — Z0000 Encounter for general adult medical examination without abnormal findings: Secondary | ICD-10-CM

## 2012-07-27 LAB — COMPREHENSIVE METABOLIC PANEL
ALT: 33 U/L (ref 0–35)
Alkaline Phosphatase: 88 U/L (ref 39–117)
CO2: 26 mEq/L (ref 19–32)
Calcium: 10.1 mg/dL (ref 8.4–10.5)
Chloride: 103 mEq/L (ref 96–112)
Creatinine, Ser: 0.6 mg/dL (ref 0.4–1.2)
Glucose, Bld: 82 mg/dL (ref 70–99)
Sodium: 139 mEq/L (ref 135–145)
Total Bilirubin: 0.7 mg/dL (ref 0.3–1.2)
Total Protein: 7.7 g/dL (ref 6.0–8.3)

## 2012-07-27 LAB — LIPID PANEL
HDL: 50.3 mg/dL (ref >39.00)
VLDL: 51.4 mg/dL — ABNORMAL HIGH (ref 0.0–40.0)

## 2012-07-27 LAB — LDL CHOLESTEROL, DIRECT: Direct LDL: 152.8 mg/dL

## 2012-07-27 LAB — TSH: TSH: 1.1 u[IU]/mL (ref 0.35–5.50)

## 2012-07-27 MED ORDER — HYDROCHLOROTHIAZIDE 25 MG PO TABS: 25.0000 mg | ORAL_TABLET | Freq: Every day | ORAL | Status: DC

## 2012-07-27 NOTE — Progress Notes (Signed)
Patient ID: Kayla [name], female   DOB: 1941-06-14, 71 y.o.   MRN: [id number]  Subjective:    Patient ID: Kayla [name], female    DOB: 1941/08/23, 71 y.o.   MRN: [id number]  Hypertension Pertinent negatives include no chest pain, headaches, palpitations or shortness of breath.    History of Present Illness:   71  year-old who is in today for a wellness exam. She has a history of treated hypertension. Complains include right groin and right shoulder pain. She denies any cardiopulmonary complaints. She remains quite active with walking and golf, but at times uncomfortable ;  was evaluated in [place] during the spring for what sounds like acute vertigo which has not recurred  Wt Readings from Last 3 Encounters:  07/27/12 148 lb (67.132 kg)  07/24/11 146 lb (66.225 kg)  11/01/10 146 lb (66.225 kg)    Here for Medicare AWV:   1. Risk factors based on Past M, S, F history:cardiovascular risk factors include a history of hypertension  2. Physical Activities: remains quite active with walking daily and also a golf  3. Depression/mood: no history of depression or mood disorder  4. Hearing: no deficits  5. ADL's: independent in all aspects of daily living  6. Fall Risk: low  7. Home Safety: no problems identified  8. Height, weight, &visual acuity:height and weight stable. No prior bone density study. No difficulty with visual acuity  9. Counseling: heart healthy diet. Discussed  10. Labs ordered based on risk factors: laboratory studies will be reviewed, including lipid profile  11. Referral Coordination- may require orthopedic referral if symptoms refractory to short-term anti-inflammatory medication  12. Care Plan- bone density study ordered  13. Cognitive Assessment- alert and oriented, with normal affect and memory dysfunction, able to perform all executive functions. No functional limitations   Current Medications (verified):  1) Aspirin 81 Mg Tbec (Aspirin) .... Take 1 Tablet By  Mouth Every Morning  2) Hydrochlorothiazide 25 Mg Tabs (Hydrochlorothiazide) .... Take 1 Tablet By Mouth Once A Day  3) Multivitamins Tabs (Multiple Vitamin) .... Once Daily  4) Caltrate 600+d 600-400 Mg-Unit Tabs (Calcium Carbonate-Vitamin D) .... Qd   Allergies (verified):  No Known Drug Allergies   Past History:  Past Medical History:  Reviewed history from 06/01/2008 and no changes required.  Hypertension  gravida 3, para 3, abortus zero  Hyperlipidemia  menopausal syndrome   Past Surgical History:  Reviewed history from 03/13/2008 and no changes required.  colonoscopy May 2007   Family History:  Reviewed history from 07/06/2009 and no changes required.  father died age 62, heart failure  mother died age 32, lung cancer  one brother is well   Social History:  Reviewed history from 06/01/2008 and no changes required.  Married  Never Smoked  Regular exercise-yes      Review of Systems  Constitutional: Negative for fever, appetite change, fatigue and unexpected weight change.  HENT: Negative for hearing loss, ear pain, nosebleeds, congestion, sore throat, mouth sores, trouble swallowing, neck stiffness, dental problem, voice change, sinus pressure and tinnitus.   Eyes: Negative for photophobia, pain, redness and visual disturbance.  Respiratory: Negative for cough, chest tightness and shortness of breath.   Cardiovascular: Negative for chest pain, palpitations and leg swelling.  Gastrointestinal: Negative for nausea, vomiting, abdominal pain, diarrhea, constipation, blood in stool, abdominal distention and rectal pain.  Genitourinary: Negative for dysuria, urgency, frequency, hematuria, flank pain, vaginal bleeding, vaginal discharge, difficulty urinating, genital sores, vaginal pain, menstrual problem  and pelvic pain.  Musculoskeletal: Negative for back pain and arthralgias.       Right hip pain. Right shoulder pain has improved greatly and is at 90%  Skin: Negative for  rash.  Neurological: Negative for dizziness, syncope, speech difficulty, weakness, light-headedness, numbness and headaches.  Hematological: Negative for adenopathy. Does not bruise/bleed easily.  Psychiatric/Behavioral: Negative for suicidal ideas, behavioral problems, self-injury, dysphoric mood and agitation. The patient is not nervous/anxious.        Objective:   Physical Exam  Constitutional: She is oriented to person, place, and time. She appears well-developed and well-nourished.  HENT:  Head: Normocephalic and atraumatic.  Right Ear: External ear normal.  Left Ear: External ear normal.  Mouth/Throat: Oropharynx is clear and moist.  Eyes: Conjunctivae and EOM are normal.  Neck: Normal range of motion. Neck supple. No JVD present. No thyromegaly present.  Cardiovascular: Normal rate, regular rhythm, normal heart sounds and intact distal pulses.   No murmur heard. Pulmonary/Chest: Effort normal and breath sounds normal. She has no wheezes. She has no rales.  Abdominal: Soft. Bowel sounds are normal. She exhibits no distension and no mass. There is no tenderness. There is no rebound and no guarding.  Musculoskeletal: Normal range of motion. She exhibits no edema and no tenderness.  Neurological: She is alert and oriented to person, place, and time. She has normal reflexes. No cranial nerve deficit. She exhibits normal muscle tone. Coordination normal.  Skin: Skin is warm and dry. No rash noted.  Psychiatric: She has a normal mood and affect. Her behavior is normal.          Assessment & Plan:    Preventive health Hypertension stable  menopausal syndrome Right shoulder right groin discomfort. Pain is minor we'll continue to observe Screening mammogram encouraged Recheck 1 year

## 2012-07-27 NOTE — Patient Instructions (Signed)
Limit your sodium (Salt) intake    It is important that you exercise regularly, at least 20 minutes 3 to 4 times per week.  If you develop chest pain or shortness of breath seek  medical attention.  Please check your blood pressure on a regular basis.  If it is consistently greater than 150/90, please make an office appointment.  You need to lose weight.  Consider a lower calorie diet and regular exercise.  Take a calcium supplement, plus [phone] units of vitamin D  Schedule your mammogram.

## 2012-08-31 ENCOUNTER — Other Ambulatory Visit: Payer: Self-pay | Admitting: Internal Medicine

## 2012-08-31 DIAGNOSIS — Z1231 Encounter for screening mammogram for malignant neoplasm of breast: Secondary | ICD-10-CM

## 2012-09-06 ENCOUNTER — Ambulatory Visit (HOSPITAL_COMMUNITY)
Admission: RE | Admit: 2012-09-06 | Discharge: 2012-09-06 | Disposition: A | Discharge status: Home / Self Care | Payer: Medicare Other | Source: Ambulatory Visit | Attending: Internal Medicine | Admitting: Internal Medicine

## 2012-09-06 DIAGNOSIS — Z1231 Encounter for screening mammogram for malignant neoplasm of breast: Secondary | ICD-10-CM | POA: Insufficient documentation

## 2012-09-09 ENCOUNTER — Other Ambulatory Visit: Payer: Self-pay | Admitting: Internal Medicine

## 2012-09-09 DIAGNOSIS — R928 Other abnormal and inconclusive findings on diagnostic imaging of breast: Secondary | ICD-10-CM

## 2012-09-10 ENCOUNTER — Ambulatory Visit
Admission: RE | Admit: 2012-09-10 | Discharge: 2012-09-10 | Disposition: A | Discharge status: Home / Self Care | Payer: Medicare Other | Source: Ambulatory Visit | Attending: Internal Medicine | Admitting: Internal Medicine

## 2012-09-10 DIAGNOSIS — R928 Other abnormal and inconclusive findings on diagnostic imaging of breast: Secondary | ICD-10-CM

## 2012-09-16 ENCOUNTER — Other Ambulatory Visit: Payer: Medicare Other

## 2013-07-28 ENCOUNTER — Ambulatory Visit (INDEPENDENT_AMBULATORY_CARE_PROVIDER_SITE_OTHER): Payer: Medicare PPO | Admitting: Internal Medicine

## 2013-07-28 ENCOUNTER — Encounter: Payer: Self-pay | Admitting: Internal Medicine

## 2013-07-28 VITALS — BP 128/80 | HR 70 | Temp 97.9°F | Resp 20 | Ht 62.75 in | Wt 147.0 lb

## 2013-07-28 DIAGNOSIS — Z23 Encounter for immunization: Secondary | ICD-10-CM

## 2013-07-28 DIAGNOSIS — I10 Essential (primary) hypertension: Secondary | ICD-10-CM

## 2013-07-28 DIAGNOSIS — Z0000 Encounter for general adult medical examination without abnormal findings: Secondary | ICD-10-CM

## 2013-07-28 DIAGNOSIS — E785 Hyperlipidemia, unspecified: Secondary | ICD-10-CM

## 2013-07-28 LAB — COMPREHENSIVE METABOLIC PANEL
ALT: 22 U/L (ref 0–35)
AST: 21 U/L (ref 0–37)
BUN: 15 mg/dL (ref 6–23)
Creatinine, Ser: 0.7 mg/dL (ref 0.4–1.2)
Total Bilirubin: 0.8 mg/dL (ref 0.3–1.2)

## 2013-07-28 LAB — LIPID PANEL
Cholesterol: 248 mg/dL — ABNORMAL HIGH (ref 0–200)
HDL: 48.2 mg/dL (ref >39.00)
Total CHOL/HDL Ratio: 5
Triglycerides: 285 mg/dL — ABNORMAL HIGH (ref 0.0–149.0)
VLDL: 57 mg/dL — ABNORMAL HIGH (ref 0.0–40.0)

## 2013-07-28 LAB — CBC WITH DIFFERENTIAL/PLATELET
Basophils Absolute: 0 10*3/uL (ref 0.0–0.1)
Basophils Relative: 0.4 % (ref 0.0–3.0)
HCT: 45.4 % (ref 36.0–46.0)
Hemoglobin: 15.4 g/dL — ABNORMAL HIGH (ref 12.0–15.0)
Lymphocytes Relative: 25.6 % (ref 12.0–46.0)
Lymphs Abs: 1.9 10*3/uL (ref 0.7–4.0)
MCHC: 33.8 g/dL (ref 30.0–36.0)
Monocytes Relative: 8.7 % (ref 3.0–12.0)
Neutro Abs: 4.8 10*3/uL (ref 1.4–7.7)
RBC: 4.92 Mil/uL (ref 3.87–5.11)
RDW: 13.6 % (ref 11.5–14.6)

## 2013-07-28 LAB — TSH: TSH: 1.24 u[IU]/mL (ref 0.35–5.50)

## 2013-07-28 MED ORDER — HYDROCHLOROTHIAZIDE 25 MG PO TABS: 25.0000 mg | ORAL_TABLET | Freq: Every day | ORAL | Status: DC

## 2013-07-28 NOTE — Patient Instructions (Signed)
Limit your sodium (Salt) intake    It is important that you exercise regularly, at least 20 minutes 3 to 4 times per week.  If you develop chest pain or shortness of breath seek  medical attention.  Please check your blood pressure on a regular basis.  If it is consistently greater than 150/90, please make an office appointment.  Return in one year for follow-up  Take a calcium supplement, plus 800-1200 units of vitamin D 

## 2013-07-28 NOTE — Progress Notes (Signed)
Patient ID: ARDINE IACOVELLI, female   DOB: Jun 11, 1941, 72 y.o.   MRN: [id number] Patient ID: ANNARAE MACNAIR, female   DOB: 17-Jun-1941, 72 y.o.   MRN: [id number]  Subjective:    Patient ID: CELIA FRIEDLAND, female    DOB: 1941-02-11, 72 y.o.   MRN: [id number]  Hypertension Pertinent negatives include no chest pain, headaches, palpitations or shortness of breath.    History of Present Illness:   72   year-old who is in today for a wellness exam. She has a history of treated hypertension.  She denies any cardiopulmonary complaints. She remains quite active with walking and golf, and enjoying  her 4 grandchildren ;  was evaluated in [place] during the spring for what sounds like acute vertigo which has not recurred  Wt Readings from Last 3 Encounters:  07/28/13 147 lb (66.679 kg)  07/27/12 148 lb (67.132 kg)  07/24/11 146 lb (66.225 kg)    Here for Medicare AWV:   1. Risk factors based on Past M, S, F history:cardiovascular risk factors include a history of hypertension  2. Physical Activities: remains quite active with walking daily and also  golf  3. Depression/mood: no history of depression or mood disorder  4. Hearing: no deficits  5. ADL's: independent in all aspects of daily living  6. Fall Risk: low  7. Home Safety: no problems identified  8. Height, weight, &visual acuity:height and weight stable. No prior bone density study. No difficulty with visual acuity  9. Counseling: heart healthy diet. Discussed  10. Labs ordered based on risk factors: laboratory studies will be reviewed, including lipid profile  11. Referral Coordination- may require orthopedic referral if symptoms refractory to short-term anti-inflammatory medication  12. Care Plan- bone density study ordered  13. Cognitive Assessment- alert and oriented, with normal affect and memory dysfunction, able to perform all executive functions. No functional limitations   Current Medications (verified):  1) Aspirin 81 Mg Tbec  (Aspirin) .... Take 1 Tablet By Mouth Every Morning  2) Hydrochlorothiazide 25 Mg Tabs (Hydrochlorothiazide) .... Take 1 Tablet By Mouth Once A Day  3) Multivitamins Tabs (Multiple Vitamin) .... Once Daily  4) Caltrate 600+d 600-400 Mg-Unit Tabs (Calcium Carbonate-Vitamin D) .... Qd   Allergies (verified):  No Known Drug Allergies   Past History:  Past Medical History:  Reviewed history from 06/01/2008 and no changes required.  Hypertension  gravida 3, para 3, abortus zero  Hyperlipidemia  menopausal syndrome   Past Surgical History:  Reviewed history from 03/13/2008 and no changes required.  colonoscopy May 2007   Family History:  Reviewed history from 07/06/2009 and no changes required.  father died age 53, heart failure  mother died age 33, lung cancer  one brother is well   Social History:  Reviewed history from 06/01/2008 and no changes required.  Married  Never Smoked  Regular exercise-yes      Review of Systems  Constitutional: Negative for fever, appetite change, fatigue and unexpected weight change.  HENT: Negative for hearing loss, ear pain, nosebleeds, congestion, sore throat, mouth sores, trouble swallowing, neck stiffness, dental problem, voice change, sinus pressure and tinnitus.   Eyes: Negative for photophobia, pain, redness and visual disturbance.  Respiratory: Negative for cough, chest tightness and shortness of breath.   Cardiovascular: Negative for chest pain, palpitations and leg swelling.  Gastrointestinal: Negative for nausea, vomiting, abdominal pain, diarrhea, constipation, blood in stool, abdominal distention and rectal pain.  Genitourinary: Negative for dysuria, urgency, frequency, hematuria, flank pain,  vaginal bleeding, vaginal discharge, difficulty urinating, genital sores, vaginal pain, menstrual problem and pelvic pain.  Musculoskeletal: Negative for back pain and arthralgias.  Skin: Negative for rash.  Neurological: Negative for dizziness,  syncope, speech difficulty, weakness, light-headedness, numbness and headaches.  Hematological: Negative for adenopathy. Does not bruise/bleed easily.  Psychiatric/Behavioral: Negative for suicidal ideas, behavioral problems, self-injury, dysphoric mood and agitation. The patient is not nervous/anxious.        Objective:   Physical Exam  Constitutional: She is oriented to person, place, and time. She appears well-developed and well-nourished.  HENT:  Head: Normocephalic and atraumatic.  Right Ear: External ear normal.  Left Ear: External ear normal.  Mouth/Throat: Oropharynx is clear and moist.  Eyes: Conjunctivae and EOM are normal.  Neck: Normal range of motion. Neck supple. No JVD present. No thyromegaly present.  Cardiovascular: Normal rate, regular rhythm, normal heart sounds and intact distal pulses.   No murmur heard. Pulmonary/Chest: Effort normal and breath sounds normal. She has no wheezes. She has no rales.  Abdominal: Soft. Bowel sounds are normal. She exhibits no distension and no mass. There is no tenderness. There is no rebound and no guarding.  Musculoskeletal: Normal range of motion. She exhibits no edema and no tenderness.  Neurological: She is alert and oriented to person, place, and time. She has normal reflexes. No cranial nerve deficit. She exhibits normal muscle tone. Coordination normal.  Skin: Skin is warm and dry. No rash noted.  Psychiatric: She has a normal mood and affect. Her behavior is normal.          Assessment & Plan:    Preventive health Hypertension stable  menopausal syndrome  Screening mammogram encouraged Recheck 1 year Will review screening lab Medications unchanged Hydrochlorothiazide refilled

## 2014-06-02 ENCOUNTER — Encounter: Payer: Self-pay | Admitting: Internal Medicine

## 2014-06-02 ENCOUNTER — Ambulatory Visit (INDEPENDENT_AMBULATORY_CARE_PROVIDER_SITE_OTHER): Payer: Medicare PPO | Admitting: Internal Medicine

## 2014-06-02 ENCOUNTER — Ambulatory Visit (INDEPENDENT_AMBULATORY_CARE_PROVIDER_SITE_OTHER)
Admission: RE | Admit: 2014-06-02 | Discharge: 2014-06-02 | Disposition: A | Discharge status: Home / Self Care | Payer: Medicare PPO | Source: Ambulatory Visit | Attending: Internal Medicine | Admitting: Internal Medicine

## 2014-06-02 VITALS — BP 120/80 | HR 84 | Temp 98.0°F | Resp 20 | Ht 62.75 in | Wt 152.0 lb

## 2014-06-02 DIAGNOSIS — M25551 Pain in right hip: Secondary | ICD-10-CM

## 2014-06-02 DIAGNOSIS — M25559 Pain in unspecified hip: Secondary | ICD-10-CM

## 2014-06-02 DIAGNOSIS — I10 Essential (primary) hypertension: Secondary | ICD-10-CM

## 2014-06-02 MED ORDER — MELOXICAM 7.5 MG PO TABS: 7.5000 mg | ORAL_TABLET | Freq: Every day | ORAL | Status: DC

## 2014-06-02 NOTE — Patient Instructions (Signed)
Call or return to clinic prn if these symptoms worsen or fail to improve as anticipated.  X-ray right hip as discussed

## 2014-06-02 NOTE — Progress Notes (Signed)
Pre visit review using our clinic review tool, if applicable. No additional management support is needed unless otherwise documented below in the visit note. 

## 2014-06-02 NOTE — Progress Notes (Signed)
Subjective:    Patient ID: Kayla [name], female    DOB: 1941-03-06, 73 y.o.   MRN: [id number]  HPI  73 year old patient who has a history of treated hypertension.  She presents with a several month history of progressive right hip and groin discomfort.  Pain is aggravated by walking and occasionally causes limping.  She remains reactive with golf and daily walks.  She has used some anti-inflammatory medication Past Medical History  Diagnosis Date  . CHEST WALL PAIN, ANTERIOR 07/26/2008  . HYPERLIPIDEMIA 06/01/2008  . HYPERTENSION 07/28/2007  . MENOPAUSAL SYNDROME 06/01/2008    History   Social History  . Marital Status: Married    Spouse Name: N/A    Number of Children: N/A  . Years of Education: N/A   Occupational History  . Not on file.   Social History Main Topics  . Smoking status: Never Smoker   . Smokeless tobacco: Never Used  . Alcohol Use: Yes  . Drug Use: No  . Sexual Activity: Not on file   Other Topics Concern  . Not on file   Social History Narrative  . No narrative on file    History reviewed. No pertinent past surgical history.  Family History  Problem Relation Age of Onset  . Cancer Mother     lung ca  . Heart disease Father     No Known Allergies  Current Outpatient Prescriptions on File Prior to Visit  Medication Sig Dispense Refill  . aspirin 81 MG tablet Take 81 mg by mouth daily.        . hydrochlorothiazide (HYDRODIURIL) 25 MG tablet Take 1 tablet (25 mg total) by mouth daily.  120 tablet  5   No current facility-administered medications on file prior to visit.    BP 120/80  Pulse 84  Temp(Src) 98 F (36.7 C) (Oral)  Resp 20  Ht 5' 2.75" (1.594 m)  Wt 152 lb (68.947 kg)  BMI 27.14 kg/m2  SpO2 95%     Review of Systems  Constitutional: Negative.   HENT: Negative for congestion, dental problem, hearing loss, rhinorrhea, sinus pressure, sore throat and tinnitus.   Eyes: Negative for pain, discharge and visual disturbance.    Respiratory: Negative for cough and shortness of breath.   Cardiovascular: Negative for chest pain, palpitations and leg swelling.  Gastrointestinal: Negative for nausea, vomiting, abdominal pain, diarrhea, constipation, blood in stool and abdominal distention.  Genitourinary: Negative for dysuria, urgency, frequency, hematuria, flank pain, vaginal bleeding, vaginal discharge, difficulty urinating, vaginal pain and pelvic pain.       Right groin pain  Musculoskeletal: Negative for arthralgias, gait problem and joint swelling.  Skin: Negative for rash.  Neurological: Negative for dizziness, syncope, speech difficulty, weakness, numbness and headaches.  Hematological: Negative for adenopathy.  Psychiatric/Behavioral: Negative for behavioral problems, dysphoric mood and agitation. The patient is not nervous/anxious.        Objective:   Physical Exam  Constitutional: She appears well-developed and well-nourished. No distress.  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  Mild tenderness over the right greater trochanter bursa.  We'll treat with Tylenol and if needed.  The anti-inflammatory medications.  We'll  check a right hip x-ray.  May need orthopedic referral if right hip pain is lifestyle limiting

## 2014-06-06 ENCOUNTER — Other Ambulatory Visit: Payer: Self-pay | Admitting: Internal Medicine

## 2014-06-06 DIAGNOSIS — M25551 Pain in right hip: Secondary | ICD-10-CM

## 2014-06-21 NOTE — Pre-Procedure Instructions (Signed)
Janeece Ageelaine V Mifflin  06/21/2014   Your procedure is scheduled on: Tuesday July 04, 2014 at 1:32 PM.  [facility] at 11:30 AM.  Call this number if you have problems the morning of surgery: [phone]   Remember:   Do not eat food or drink liquids after midnight.   Take these medicines the morning of surgery with A SIP OF WATER: NONE   Do not wear jewelry, make-up or nail polish.  Do not wear lotions, powders, or perfumes.   Do not shave 48 hours prior to surgery.   Do not bring valuables to the hospital.  [facility] any belongings or valuables.               Contacts, dentures or bridgework may not be worn into surgery.  Leave suitcase in the car. After surgery it may be brought to your room.  For patients admitted to the hospital, discharge time is determined by your treatment team.               Patients discharged the day of surgery will not be allowed to drive home.  Name and phone number of your driver: Family/Friend  Special Instructions: Shower using CHG soap the night before and the morning of your surgery   Please read over the following fact sheets that you were given: Pain Booklet, Coughing and Deep Breathing, Blood Transfusion Information, Total Joint Packet, MRSA Information and Surgical Site Infection Prevention

## 2014-06-22 ENCOUNTER — Encounter (HOSPITAL_COMMUNITY): Payer: Self-pay

## 2014-06-22 ENCOUNTER — Encounter (HOSPITAL_COMMUNITY)
Admission: RE | Admit: 2014-06-22 | Discharge: 2014-06-22 | Disposition: A | Discharge status: Home / Self Care | Payer: Medicare PPO | Source: Ambulatory Visit | Attending: Orthopedic Surgery | Admitting: Orthopedic Surgery

## 2014-06-22 ENCOUNTER — Encounter (HOSPITAL_COMMUNITY)
Admission: RE | Admit: 2014-06-22 | Discharge: 2014-06-22 | Disposition: A | Discharge status: Home / Self Care | Payer: Medicare PPO | Source: Ambulatory Visit | Attending: Anesthesiology | Admitting: Anesthesiology

## 2014-06-22 DIAGNOSIS — Z01812 Encounter for preprocedural laboratory examination: Secondary | ICD-10-CM | POA: Insufficient documentation

## 2014-06-22 DIAGNOSIS — Z01810 Encounter for preprocedural cardiovascular examination: Secondary | ICD-10-CM | POA: Diagnosis not present

## 2014-06-22 DIAGNOSIS — Z01818 Encounter for other preprocedural examination: Secondary | ICD-10-CM | POA: Diagnosis not present

## 2014-06-22 LAB — PROTIME-INR
INR: 0.97 (ref 0.00–1.49)
Prothrombin Time: 12.9 seconds (ref 11.6–15.2)

## 2014-06-22 LAB — BASIC METABOLIC PANEL
ANION GAP: 12 (ref 5–15)
BUN: 16 mg/dL (ref 6–23)
CALCIUM: 10.2 mg/dL (ref 8.4–10.5)
CO2: 25 mEq/L (ref 19–32)
CREATININE: 0.69 mg/dL (ref 0.50–1.10)
Chloride: 107 mEq/L (ref 96–112)
GFR, EST NON AFRICAN AMERICAN: 84 mL/min — AB (ref >90)
Glucose, Bld: 94 mg/dL (ref 70–99)
Potassium: 4.2 mEq/L (ref 3.7–5.3)
Sodium: 144 mEq/L (ref 137–147)

## 2014-06-22 LAB — CBC
HCT: 45.1 % (ref 36.0–46.0)
Hemoglobin: 15.1 g/dL — ABNORMAL HIGH (ref 12.0–15.0)
MCH: 31.1 pg (ref 26.0–34.0)
MCHC: 33.5 g/dL (ref 30.0–36.0)
MCV: 92.8 fL (ref 78.0–100.0)
PLATELETS: 259 10*3/uL (ref 150–400)
RBC: 4.86 MIL/uL (ref 3.87–5.11)
RDW: 13.3 % (ref 11.5–15.5)
WBC: 7.1 10*3/uL (ref 4.0–10.5)

## 2014-06-22 LAB — SURGICAL PCR SCREEN
MRSA, PCR: NEGATIVE
STAPHYLOCOCCUS AUREUS: NEGATIVE

## 2014-06-22 LAB — ABO/RH: ABO/RH(D): O POS

## 2014-06-22 LAB — TYPE AND SCREEN
ABO/RH(D): O POS
Antibody Screen: NEGATIVE

## 2014-06-22 LAB — APTT: aPTT: 28 seconds (ref 24–37)

## 2014-06-22 NOTE — Progress Notes (Signed)
PCP is Dr. Lesia HausenKwiatowski; [facility] Patient denied having any surgical history, stress test, cardiac cath, or sleep study. Patient informed Nurse that she recently had some chest pressure on Monday but thought it occurred due to the "heavy lifting" she had been recently doing, however patient denied having any current chest pressure or discomfort. EKG ordered. When asked about aspirin patient informed Nurse that she was instructed to stop ALL her medications a week before her surgery. Nurse instructed patient to stop aspirin and multivitamin on August 11th, but to continue taking HCTZ, but not to take it the morning of her surgery. Patient verbalized understanding, and also made a note to herself on the discharge instruction sheet and stated "I must have misunderstood what they told me at the doctors office because I was under the impression that they wanted me to stop taking my blood pressure medication a week before my surgery."

## 2014-06-23 NOTE — H&P (Signed)
  Jakalyn Kratky/[facility] [address]   [address] [place], [place] [id number] [phone] A Division of [facility]  Loreta Aveaniel F. Gordana Kewley, M.D.   Robert A. Thurston HoleWainer, M.D.   Burnell BlanksW. Dan Caffrey, M.D.   Eulas PostJoshua P. Landau, M.D.   Lunette StandsAnna Voytek, M.D Jewel Baizeimothy D. Eulah PontMurphy, M.D.  Buford DresserWesley R. Ibazebo, M.D.  Charlsie QuestS. Michael Tooke, M.D.  Estell HarpinJames S. Kramer, M.D.   Melina Fiddlerebecca S. Bassett, M.D. Mary L. Isidoro DonningAnton, PA-C  Kirstin A. Shepperson, PA-C  Josh Highlandhadwell, PA-C NorwoodBrandon Parry, [place]-C   RE: Kayla BuddsSmith, Kayla [name]   [phone]      DOB: 11-11-1941 PROGRESS NOTE: 06-14-14 History:  Kayla Loselaine is here for follow-up of her right hip and questions about surgery. She is scheduled for total hip arthroplasty on 07/04/14. She still has pain rated 6/10.  Please see associated documentation for this clinic visit for further past medical, family, social history, review of systems, and exam findings.  EXAMINATION: She has sensation intact distally to her toes on the right side. She has hip flexion to 90 degrees abduction 20 degrees external rotation to 20 degrees internal rotation to 10-20 degrees with pain.  IMPRESSION: Right hip end stage degenerative arthritis.  PLAN: We discussed the surgery in detail and answered her questions regarding operative implants, duration of stay in the hospital and rehab as well as post-op medications and spinal versus general anesthesia. She will meet with anesthesia prior to surgery to discuss spinal versus general anesthesia. She will follow-up post-op with us.  Jewel Baizeimothy D.  Eulah PontMurphy, M.D. Electronically verified by Jewel Baizeimothy D. Eulah PontMurphy, M.D. Dictated by Sherlynn CarbonBrandon D. Juan QuamParry, OPA-C Cc:  Eleonore ChiquitoPeter Kwiatkowski, MD fax [phone]  D 06-15-14 T 06-16-14

## 2014-06-26 ENCOUNTER — Other Ambulatory Visit (HOSPITAL_COMMUNITY): Payer: Medicare PPO

## 2014-07-03 MED ORDER — CEFAZOLIN SODIUM-DEXTROSE 2-3 GM-% IV SOLR
2.0000 g | INTRAVENOUS | Status: AC
  Administered 2014-07-04: 2 g via INTRAVENOUS
  Filled 2014-07-03: qty 50

## 2014-07-03 MED ORDER — ACETAMINOPHEN 500 MG PO TABS
1000.0000 mg | ORAL_TABLET | Freq: Once | ORAL | Status: AC
  Administered 2014-07-04: 1000 mg via ORAL
  Filled 2014-07-03: qty 2

## 2014-07-03 MED ORDER — DEXTROSE-NACL 5-0.45 % IV SOLN: 100.0000 mL/h | INTRAVENOUS | Status: DC

## 2014-07-03 MED ORDER — CHLORHEXIDINE GLUCONATE 4 % EX LIQD
60.0000 mL | Freq: Once | CUTANEOUS | Status: DC
  Filled 2014-07-03: qty 60

## 2014-07-03 NOTE — Progress Notes (Signed)
Left messages on cell and home number to arrive at 10am tomorrow morning

## 2014-07-04 ENCOUNTER — Inpatient Hospital Stay (HOSPITAL_COMMUNITY): Payer: Medicare PPO

## 2014-07-04 ENCOUNTER — Inpatient Hospital Stay (HOSPITAL_COMMUNITY): Payer: Medicare PPO | Admitting: Certified Registered Nurse Anesthetist

## 2014-07-04 ENCOUNTER — Encounter (HOSPITAL_COMMUNITY): Payer: Self-pay | Admitting: Anesthesiology

## 2014-07-04 ENCOUNTER — Inpatient Hospital Stay (HOSPITAL_COMMUNITY)
Admission: RE | Admit: 2014-07-04 | Discharge: 2014-07-06 | DRG: 470 | Disposition: A | Discharge status: Home Health | Payer: Medicare PPO | Source: Ambulatory Visit | Attending: Orthopedic Surgery | Admitting: Orthopedic Surgery

## 2014-07-04 ENCOUNTER — Encounter (HOSPITAL_COMMUNITY)
Admission: RE | Disposition: A | Discharge status: Home Health | Payer: Self-pay | Source: Ambulatory Visit | Attending: Orthopedic Surgery

## 2014-07-04 ENCOUNTER — Encounter (HOSPITAL_COMMUNITY): Payer: Medicare PPO | Admitting: Certified Registered Nurse Anesthetist

## 2014-07-04 DIAGNOSIS — M169 Osteoarthritis of hip, unspecified: Principal | ICD-10-CM | POA: Diagnosis present

## 2014-07-04 DIAGNOSIS — I10 Essential (primary) hypertension: Secondary | ICD-10-CM | POA: Diagnosis present

## 2014-07-04 DIAGNOSIS — E785 Hyperlipidemia, unspecified: Secondary | ICD-10-CM | POA: Diagnosis present

## 2014-07-04 DIAGNOSIS — M1990 Unspecified osteoarthritis, unspecified site: Secondary | ICD-10-CM | POA: Diagnosis present

## 2014-07-04 DIAGNOSIS — M1610 Unilateral primary osteoarthritis, unspecified hip: Secondary | ICD-10-CM | POA: Diagnosis present

## 2014-07-04 HISTORY — PX: TOTAL HIP ARTHROPLASTY: SHX124

## 2014-07-04 SURGERY — ARTHROPLASTY, HIP, TOTAL, ANTERIOR APPROACH
Anesthesia: General | Site: Hip | Laterality: Right

## 2014-07-04 MED ORDER — PROPOFOL 10 MG/ML IV BOLUS
INTRAVENOUS | Status: AC
  Filled 2014-07-04: qty 20

## 2014-07-04 MED ORDER — LIDOCAINE HCL (CARDIAC) 20 MG/ML IV SOLN
INTRAVENOUS | Status: DC | PRN
  Administered 2014-07-04: 100 mg via INTRAVENOUS

## 2014-07-04 MED ORDER — HYDROMORPHONE HCL PF 1 MG/ML IJ SOLN
INTRAMUSCULAR | Status: AC
  Filled 2014-07-04: qty 1

## 2014-07-04 MED ORDER — ONDANSETRON HCL 4 MG PO TABS: 4.0000 mg | ORAL_TABLET | Freq: Four times a day (QID) | ORAL | Status: DC | PRN

## 2014-07-04 MED ORDER — ROCURONIUM BROMIDE 100 MG/10ML IV SOLN
INTRAVENOUS | Status: DC | PRN
  Administered 2014-07-04: 10 mg via INTRAVENOUS
  Administered 2014-07-04: 50 mg via INTRAVENOUS

## 2014-07-04 MED ORDER — PHENYLEPHRINE HCL 10 MG/ML IJ SOLN
INTRAMUSCULAR | Status: DC | PRN
  Administered 2014-07-04: 80 ug via INTRAVENOUS
  Administered 2014-07-04: 40 ug via INTRAVENOUS
  Administered 2014-07-04: 80 ug via INTRAVENOUS
  Administered 2014-07-04: 40 ug via INTRAVENOUS

## 2014-07-04 MED ORDER — FENTANYL CITRATE 0.05 MG/ML IJ SOLN
INTRAMUSCULAR | Status: AC
  Filled 2014-07-04: qty 5

## 2014-07-04 MED ORDER — CELECOXIB 200 MG PO CAPS
200.0000 mg | ORAL_CAPSULE | Freq: Two times a day (BID) | ORAL | Status: DC
  Administered 2014-07-04 – 2014-07-06 (×4): 200 mg via ORAL
  Filled 2014-07-04 (×5): qty 1

## 2014-07-04 MED ORDER — MIDAZOLAM HCL 2 MG/2ML IJ SOLN
INTRAMUSCULAR | Status: AC
  Filled 2014-07-04: qty 2

## 2014-07-04 MED ORDER — PHENYLEPHRINE 40 MCG/ML (10ML) SYRINGE FOR IV PUSH (FOR BLOOD PRESSURE SUPPORT)
PREFILLED_SYRINGE | INTRAVENOUS | Status: AC
  Filled 2014-07-04: qty 10

## 2014-07-04 MED ORDER — ONDANSETRON HCL 4 MG/2ML IJ SOLN: 4.0000 mg | Freq: Four times a day (QID) | INTRAMUSCULAR | Status: DC | PRN

## 2014-07-04 MED ORDER — GLYCOPYRROLATE 0.2 MG/ML IJ SOLN
INTRAMUSCULAR | Status: AC
  Filled 2014-07-04: qty 3

## 2014-07-04 MED ORDER — METHOCARBAMOL 1000 MG/10ML IJ SOLN
500.0000 mg | Freq: Four times a day (QID) | INTRAVENOUS | Status: DC | PRN
  Filled 2014-07-04: qty 5

## 2014-07-04 MED ORDER — PROPOFOL 10 MG/ML IV BOLUS
INTRAVENOUS | Status: DC | PRN
  Administered 2014-07-04: 120 mg via INTRAVENOUS
  Administered 2014-07-04: 10 mg via INTRAVENOUS

## 2014-07-04 MED ORDER — METHOCARBAMOL 500 MG PO TABS: 500.0000 mg | ORAL_TABLET | Freq: Four times a day (QID) | ORAL | Status: DC | PRN

## 2014-07-04 MED ORDER — ONDANSETRON HCL 4 MG/2ML IJ SOLN
4.0000 mg | Freq: Once | INTRAMUSCULAR | Status: DC | PRN
Start: 2014-07-04 — End: 2014-07-04

## 2014-07-04 MED ORDER — ASPIRIN EC 325 MG PO TBEC
325.0000 mg | DELAYED_RELEASE_TABLET | Freq: Every day | ORAL | Status: DC
  Administered 2014-07-05 – 2014-07-06 (×2): 325 mg via ORAL
  Filled 2014-07-04 (×3): qty 1

## 2014-07-04 MED ORDER — ARTIFICIAL TEARS OP OINT
TOPICAL_OINTMENT | OPHTHALMIC | Status: AC
  Filled 2014-07-04: qty 3.5

## 2014-07-04 MED ORDER — FENTANYL CITRATE 0.05 MG/ML IJ SOLN
INTRAMUSCULAR | Status: DC | PRN
  Administered 2014-07-04 (×8): 50 ug via INTRAVENOUS

## 2014-07-04 MED ORDER — ACETAMINOPHEN 650 MG RE SUPP: 650.0000 mg | Freq: Four times a day (QID) | RECTAL | Status: DC | PRN

## 2014-07-04 MED ORDER — DEXTROSE-NACL 5-0.45 % IV SOLN
INTRAVENOUS | Status: DC
  Administered 2014-07-05: 13:00:00 via INTRAVENOUS

## 2014-07-04 MED ORDER — ONDANSETRON HCL 4 MG/2ML IJ SOLN
INTRAMUSCULAR | Status: DC | PRN
  Administered 2014-07-04: 4 mg via INTRAVENOUS

## 2014-07-04 MED ORDER — ROCURONIUM BROMIDE 50 MG/5ML IV SOLN
INTRAVENOUS | Status: AC
  Filled 2014-07-04: qty 1

## 2014-07-04 MED ORDER — ONDANSETRON HCL 4 MG/2ML IJ SOLN
INTRAMUSCULAR | Status: AC
  Filled 2014-07-04: qty 2

## 2014-07-04 MED ORDER — DEXTROSE 5 % IV SOLN
INTRAVENOUS | Status: DC | PRN
Start: 2014-07-04 — End: 2014-07-04
  Administered 2014-07-04: 12:00:00 via INTRAVENOUS

## 2014-07-04 MED ORDER — GLYCOPYRROLATE 0.2 MG/ML IJ SOLN
INTRAMUSCULAR | Status: DC | PRN
  Administered 2014-07-04: 0.6 mg via INTRAVENOUS

## 2014-07-04 MED ORDER — MORPHINE SULFATE 2 MG/ML IJ SOLN
2.0000 mg | INTRAMUSCULAR | Status: DC | PRN
  Administered 2014-07-04 – 2014-07-05 (×4): 2 mg via INTRAVENOUS
  Filled 2014-07-04 (×4): qty 1

## 2014-07-04 MED ORDER — DOCUSATE SODIUM 100 MG PO CAPS
100.0000 mg | ORAL_CAPSULE | Freq: Two times a day (BID) | ORAL | Status: DC
  Administered 2014-07-04 – 2014-07-06 (×4): 100 mg via ORAL
  Filled 2014-07-04 (×4): qty 1

## 2014-07-04 MED ORDER — LACTATED RINGERS IV SOLN
INTRAVENOUS | Status: DC | PRN
  Administered 2014-07-04 (×3): via INTRAVENOUS

## 2014-07-04 MED ORDER — HYDROCODONE-ACETAMINOPHEN 5-325 MG PO TABS: 1.0000 | ORAL_TABLET | ORAL | Status: DC | PRN

## 2014-07-04 MED ORDER — DOCUSATE SODIUM 100 MG PO CAPS: 100.0000 mg | ORAL_CAPSULE | Freq: Two times a day (BID) | ORAL | Status: DC

## 2014-07-04 MED ORDER — HYDROMORPHONE HCL PF 1 MG/ML IJ SOLN
0.2500 mg | INTRAMUSCULAR | Status: DC | PRN
  Administered 2014-07-04 (×2): 0.5 mg via INTRAVENOUS

## 2014-07-04 MED ORDER — OXYCODONE HCL 5 MG PO TABS: 5.0000 mg | ORAL_TABLET | Freq: Once | ORAL | Status: DC | PRN

## 2014-07-04 MED ORDER — CELECOXIB 200 MG PO CAPS: 200.0000 mg | ORAL_CAPSULE | Freq: Two times a day (BID) | ORAL | Status: DC

## 2014-07-04 MED ORDER — ONDANSETRON HCL 4 MG PO TABS: 4.0000 mg | ORAL_TABLET | Freq: Three times a day (TID) | ORAL | Status: DC | PRN

## 2014-07-04 MED ORDER — NEOSTIGMINE METHYLSULFATE 10 MG/10ML IV SOLN
INTRAVENOUS | Status: AC
  Filled 2014-07-04: qty 1

## 2014-07-04 MED ORDER — METOCLOPRAMIDE HCL 5 MG/ML IJ SOLN: 5.0000 mg | Freq: Three times a day (TID) | INTRAMUSCULAR | Status: DC | PRN

## 2014-07-04 MED ORDER — LIDOCAINE HCL (CARDIAC) 20 MG/ML IV SOLN
INTRAVENOUS | Status: AC
  Filled 2014-07-04: qty 5

## 2014-07-04 MED ORDER — HYDROCHLOROTHIAZIDE 25 MG PO TABS
25.0000 mg | ORAL_TABLET | Freq: Every day | ORAL | Status: DC
  Administered 2014-07-04 – 2014-07-06 (×2): 25 mg via ORAL
  Filled 2014-07-04 (×3): qty 1

## 2014-07-04 MED ORDER — MENTHOL 3 MG MT LOZG: 1.0000 | LOZENGE | OROMUCOSAL | Status: DC | PRN

## 2014-07-04 MED ORDER — MIDAZOLAM HCL 5 MG/5ML IJ SOLN
INTRAMUSCULAR | Status: DC | PRN
  Administered 2014-07-04 (×2): 1 mg via INTRAVENOUS

## 2014-07-04 MED ORDER — ACETAMINOPHEN 325 MG PO TABS: 650.0000 mg | ORAL_TABLET | Freq: Four times a day (QID) | ORAL | Status: DC | PRN

## 2014-07-04 MED ORDER — OXYCODONE HCL 5 MG/5ML PO SOLN: 5.0000 mg | Freq: Once | ORAL | Status: DC | PRN

## 2014-07-04 MED ORDER — METHOCARBAMOL 500 MG PO TABS
500.0000 mg | ORAL_TABLET | Freq: Four times a day (QID) | ORAL | Status: DC | PRN
  Administered 2014-07-04 – 2014-07-05 (×2): 500 mg via ORAL
  Filled 2014-07-04 (×2): qty 1

## 2014-07-04 MED ORDER — HYDROCODONE-ACETAMINOPHEN 5-325 MG PO TABS
1.0000 | ORAL_TABLET | ORAL | Status: DC | PRN
Start: 2014-07-04 — End: 2014-07-06
  Administered 2014-07-04 – 2014-07-05 (×2): 2 via ORAL
  Administered 2014-07-06: 1 via ORAL
  Filled 2014-07-04 (×2): qty 1
  Filled 2014-07-04: qty 2
  Filled 2014-07-04: qty 1
  Filled 2014-07-04: qty 2

## 2014-07-04 MED ORDER — MEPERIDINE HCL 25 MG/ML IJ SOLN: 6.2500 mg | INTRAMUSCULAR | Status: DC | PRN

## 2014-07-04 MED ORDER — CEFAZOLIN SODIUM-DEXTROSE 2-3 GM-% IV SOLR
2.0000 g | Freq: Four times a day (QID) | INTRAVENOUS | Status: AC
  Administered 2014-07-04 – 2014-07-05 (×2): 2 g via INTRAVENOUS
  Filled 2014-07-04 (×2): qty 50

## 2014-07-04 MED ORDER — PHENOL 1.4 % MT LIQD: 1.0000 | OROMUCOSAL | Status: DC | PRN

## 2014-07-04 MED ORDER — ARTIFICIAL TEARS OP OINT
TOPICAL_OINTMENT | OPHTHALMIC | Status: DC | PRN
  Administered 2014-07-04: 1 via OPHTHALMIC

## 2014-07-04 MED ORDER — ASPIRIN EC 325 MG PO TBEC: 325.0000 mg | DELAYED_RELEASE_TABLET | Freq: Every day | ORAL | Status: DC

## 2014-07-04 MED ORDER — DEXAMETHASONE SODIUM PHOSPHATE 10 MG/ML IJ SOLN
10.0000 mg | Freq: Three times a day (TID) | INTRAMUSCULAR | Status: AC
  Administered 2014-07-04 – 2014-07-05 (×2): 10 mg via INTRAVENOUS
  Filled 2014-07-04 (×3): qty 1

## 2014-07-04 MED ORDER — 0.9 % SODIUM CHLORIDE (POUR BTL) OPTIME
TOPICAL | Status: DC | PRN
  Administered 2014-07-04: 1000 mL

## 2014-07-04 MED ORDER — DEXAMETHASONE 6 MG PO TABS
10.0000 mg | ORAL_TABLET | Freq: Three times a day (TID) | ORAL | Status: AC
  Administered 2014-07-04: 10 mg via ORAL
  Filled 2014-07-04 (×3): qty 1

## 2014-07-04 MED ORDER — METOCLOPRAMIDE HCL 5 MG PO TABS
5.0000 mg | ORAL_TABLET | Freq: Three times a day (TID) | ORAL | Status: DC | PRN
  Filled 2014-07-04: qty 2

## 2014-07-04 MED ORDER — NEOSTIGMINE METHYLSULFATE 10 MG/10ML IV SOLN
INTRAVENOUS | Status: DC | PRN
  Administered 2014-07-04: 4 mg via INTRAVENOUS

## 2014-07-04 SURGICAL SUPPLY — 59 items
ADH SKN CLS APL DERMABOND .7 (GAUZE/BANDAGES/DRESSINGS) ×1
BLADE SAW SGTL 18X1.27X75 (BLADE) ×2 IMPLANT
BLADE SAW SGTL 18X1.27X75MM (BLADE) ×1
BLADE SURG ROTATE 9660 (MISCELLANEOUS) IMPLANT
COVER SURGICAL LIGHT HANDLE (MISCELLANEOUS) ×3 IMPLANT
DERMABOND ADVANCED (GAUZE/BANDAGES/DRESSINGS) ×2
DERMABOND ADVANCED .7 DNX12 (GAUZE/BANDAGES/DRESSINGS) IMPLANT
DRAPE C-ARM 42X72 X-RAY (DRAPES) ×1 IMPLANT
DRAPE IMP U-DRAPE 54X76 (DRAPES) ×3 IMPLANT
DRAPE INCISE IOBAN 66X45 STRL (DRAPES) ×3 IMPLANT
DRAPE ORTHO SPLIT 77X108 STRL (DRAPES) ×6
DRAPE PROXIMA HALF (DRAPES) ×3 IMPLANT
DRAPE SURG 17X23 STRL (DRAPES) ×1 IMPLANT
DRAPE SURG ORHT 6 SPLT 77X108 (DRAPES) ×2 IMPLANT
DRAPE U-SHAPE 47X51 STRL (DRAPES) ×6 IMPLANT
DRSG AQUACEL AG ADV 3.5X10 (GAUZE/BANDAGES/DRESSINGS) ×3 IMPLANT
DURAPREP 26ML APPLICATOR (WOUND CARE) ×3 IMPLANT
ELECT BLADE 4.0 EZ CLEAN MEGAD (MISCELLANEOUS) ×3
ELECT CAUTERY BLADE 6.4 (BLADE) ×3 IMPLANT
ELECT REM PT RETURN 9FT ADLT (ELECTROSURGICAL) ×3
ELECTRODE BLDE 4.0 EZ CLN MEGD (MISCELLANEOUS) ×1 IMPLANT
ELECTRODE REM PT RTRN 9FT ADLT (ELECTROSURGICAL) ×1 IMPLANT
FACESHIELD WRAPAROUND (MASK) ×12 IMPLANT
FACESHIELD WRAPAROUND OR TEAM (MASK) ×2 IMPLANT
GLOVE BIO SURGEON STRL SZ7.5 (GLOVE) ×5 IMPLANT
GLOVE BIOGEL M 7.0 STRL (GLOVE) IMPLANT
GLOVE BIOGEL PI IND STRL 7.5 (GLOVE) IMPLANT
GLOVE BIOGEL PI IND STRL 8 (GLOVE) ×2 IMPLANT
GLOVE BIOGEL PI INDICATOR 7.5 (GLOVE)
GLOVE BIOGEL PI INDICATOR 8 (GLOVE) ×4
GLOVE BIOGEL PI ORTHO PRO SZ8 (GLOVE)
GLOVE PI ORTHO PRO STRL SZ8 (GLOVE) IMPLANT
GLOVE SURG ORTHO 8.0 STRL STRW (GLOVE) IMPLANT
GOWN STRL REUS W/ TWL LRG LVL3 (GOWN DISPOSABLE) ×3 IMPLANT
GOWN STRL REUS W/ TWL XL LVL3 (GOWN DISPOSABLE) ×1 IMPLANT
GOWN STRL REUS W/TWL LRG LVL3 (GOWN DISPOSABLE) ×6
GOWN STRL REUS W/TWL XL LVL3 (GOWN DISPOSABLE) ×3
HIP/VIT E LINR/CERM HD LEV 1B ×2 IMPLANT
KIT BASIN OR (CUSTOM PROCEDURE TRAY) ×3 IMPLANT
KIT ROOM TURNOVER OR (KITS) ×3 IMPLANT
MANIFOLD NEPTUNE II (INSTRUMENTS) ×3 IMPLANT
NDL SAFETY ECLIPSE 18X1.5 (NEEDLE) ×1 IMPLANT
NEEDLE HYPO 18GX1.5 SHARP (NEEDLE) ×3
NS IRRIG 1000ML POUR BTL (IV SOLUTION) ×3 IMPLANT
PACK TOTAL JOINT (CUSTOM PROCEDURE TRAY) ×3 IMPLANT
PAD ARMBOARD 7.5X6 YLW CONV (MISCELLANEOUS) ×4 IMPLANT
SPONGE LAP 18X18 X RAY DECT (DISPOSABLE) ×2 IMPLANT
SUT MNCRL AB 4-0 PS2 18 (SUTURE) ×3 IMPLANT
SUT MON AB 2-0 CT1 36 (SUTURE) ×5 IMPLANT
SUT VIC AB 0 CT1 27 (SUTURE) ×3
SUT VIC AB 0 CT1 27XBRD ANBCTR (SUTURE) ×1 IMPLANT
SUT VIC AB 1 CT1 27 (SUTURE) ×3
SUT VIC AB 1 CT1 27XBRD ANBCTR (SUTURE) ×1 IMPLANT
SYR 50ML LL SCALE MARK (SYRINGE) ×3 IMPLANT
TOWEL OR 17X24 6PK STRL BLUE (TOWEL DISPOSABLE) ×3 IMPLANT
TOWEL OR 17X26 10 PK STRL BLUE (TOWEL DISPOSABLE) ×3 IMPLANT
TOWEL OR NON WOVEN STRL DISP B (DISPOSABLE) ×3 IMPLANT
TRAY FOLEY CATH 16FRSI W/METER (SET/KITS/TRAYS/PACK) IMPLANT
WATER STERILE IRR 1000ML POUR (IV SOLUTION) ×1 IMPLANT

## 2014-07-04 NOTE — Interval H&P Note (Signed)
History and Physical Interval Note:  07/04/2014 7:20 AM  Kayla [name]  has presented today for surgery, with the diagnosis of OA RIGHT HIP  The various methods of treatment have been discussed with the patient and family. After consideration of risks, benefits and other options for treatment, the patient has consented to  Procedure(s): RIGHT TOTAL HIP ARTHROPLASTY ANTERIOR APPROACH (Right) as a surgical intervention .  The patient's history has been reviewed, patient examined, no change in status, stable for surgery.  I have reviewed the patient's chart and labs.  Questions were answered to the patient's satisfaction.     MURPHY, TIMOTHY, D

## 2014-07-04 NOTE — Anesthesia Preprocedure Evaluation (Addendum)
Anesthesia Evaluation  Patient identified by MRN, date of birth, ID band Patient awake    Reviewed: Allergy & Precautions, H&P , NPO status , Patient's Chart, lab work & pertinent test results  Airway Mallampati: I TM Distance: >3 FB Neck ROM: Limited    Dental  (+) Teeth Intact, Dental Advisory Given   Pulmonary          Cardiovascular hypertension, Pt. on medications     Neuro/Psych    GI/Hepatic   Endo/Other    Renal/GU      Musculoskeletal   Abdominal   Peds  Hematology   Anesthesia Other Findings   Reproductive/Obstetrics                          Anesthesia Physical Anesthesia Plan  ASA: II  Anesthesia Plan: General   Post-op Pain Management:    Induction: Intravenous  Airway Management Planned: Oral ETT  Additional Equipment:   Intra-op Plan:   Post-operative Plan: Extubation in OR  Informed Consent: I have reviewed the patients History and Physical, chart, labs and discussed the procedure including the risks, benefits and alternatives for the proposed anesthesia with the patient or authorized representative who has indicated his/her understanding and acceptance.     Plan Discussed with: CRNA and Surgeon  Anesthesia Plan Comments:         Anesthesia Quick Evaluation

## 2014-07-04 NOTE — Discharge Instructions (Signed)
Keep incistion covered until follow up  Call with any questions or concerns  Bear weight as tolerated  Take ASA 325 daily for 30 days then go back to a baby aspirin daily.   Take celebrex daily with food.

## 2014-07-04 NOTE — Progress Notes (Signed)
Assuming care of pt at this time. All charting prior done By Sharlot Gowdaonda Hunt RN under my sign in

## 2014-07-04 NOTE — Op Note (Signed)
07/04/2014  1:20 PM  PATIENT:  Kayla [name]   MRN: [id number]  PRE-OPERATIVE DIAGNOSIS:  OA RIGHT HIP  POST-OPERATIVE DIAGNOSIS:  OA RIGHT HIP  PROCEDURE:  Procedure(s): RIGHT TOTAL HIP ARTHROPLASTY ANTERIOR APPROACH  PREOPERATIVE INDICATIONS:    Kayla [name] is an 73 y.o. female who has a diagnosis of <principal problem not specified> and elected for surgical management after failing conservative treatment.  The risks benefits and alternatives were discussed with the patient including but not limited to the risks of nonoperative treatment, versus surgical intervention including infection, bleeding, nerve injury, periprosthetic fracture, the need for revision surgery, dislocation, leg length discrepancy, blood clots, cardiopulmonary complications, morbidity, mortality, among others, and they were willing to proceed.     OPERATIVE REPORT     SURGEON:   Quinnley Colasurdo, D, MD    ASSISTANT:  none     ANESTHESIA:  General    COMPLICATIONS:  None.     COMPONENTS:  St[employer]ker acolade fit femur size 4 with a 36 mm +0 head ball and a PSL acetabular shell size 48 with a  polyethylene liner    PROCEDURE IN DETAIL:   The patient was met in the holding area and  identified.  The appropriate hip was identified and marked at the operative site.  The patient was then transported to the OR  and  placed under general anesthesia.  At that point, the patient was  placed in the supine position and  secured to the operating room table and all bony prominences padded. He received pre-operative antibiotics    The operative lower extremity was prepped from the iliac crest to the distal leg.  Sterile draping was performed.  Time out was performed prior to incision.      Skin incision was made just 3 cm distal to the ASIS and 3 cm posterior to it extending in line with the tensor fascia lata. Electrocautery was used to control all bleeders. I dissected down sharply to the fascia of the tensor fascia lata  was confirmed that the muscle fibers beneath were running posteriorly. I then incised the fascia over the superficial tensor fascia lata in line with the incision. The fascia was elevated off the anterior aspect of the muscle the muscle was retracted posteriorly and protected throughout the case. I then used electrocautery to incise the tensor fascia lata fascia control and all bleeders. Immediately visible was the fat over top of the anterior neck and capsule.  I removed the anterior fat from the capsule and elevated the rectus muscle off of the anterior capsule. I then removed a large time of capsule. The retractors were then placed over the anterior acetabulum as well as around the superior and inferior neck.  I then removed a section of the femoral neck and a napkin ring fashion. Then used the power course to remove the femoral head from the acetabulum and thoroughly irrigated the acetabulum. I sized the femoral head.    I then exposed the deep acetabulum, cleared out any tissue including the ligamentum teres.   After adequate visualization, I excised the labrum, and then sequentially reamed.  I placed the trial acetabulum, which seated nicely, and then impacted the real cup into place.  Appropriate version and inclination was confirmed clinically matching their bony anatomy, and also with the use transverse acetabular ligament.  I then abducted the leg and released the external rotators from the posterior femur allowing it to be easily delivered up lateral and anterior to the  acetabulum for preparation of the femoral canal.    I then prepared the proximal femur using the cookie-cutter and then sequentially reamed and broached.  A trial broach, neck, and head was utilized, and I reduced the hip and it was found to have excellent stability with functional range of motion. The trial components were then removed, and the real polyethylene liner was placed with the lip directed posteriorly.  I then  impacted the real femoral prosthesis into place into the appropriate version, slightly anteverted to the normal anatomy, and I impacted the real head ball into place. The hip was then reduced and taken through functional range of motion and found to have excellent stability. Leg lengths were restored.  I then irrigated the hip copiously again with, and repaired the fascia with Vicryl, followed by monocryl for the subcutaneous tissue, Monocryl for the skin, Steri-Strips and sterile gauze. The wounds were injected. The patient was then awakened and returned to PACU in stable and satisfactory condition. There were no complications.  POST OPERATIVE PLAN: WBAT, DVT px: SCD's/TED and ASA 325 daily  Edmonia Lynch, MD Orthopedic Surgeon [phone]   07/04/2014 1:20 PM

## 2014-07-04 NOTE — Progress Notes (Signed)
OT Cancellation Note  Patient Details Name: Kayla [name] MRN: [id number] DOB: 1941/04/07   Cancelled Treatment:    Reason Eval/Treat Not Completed: Fatigue/lethargy limiting ability to participate  Earlie RavelingStraub, Mikalah Skyles L OTR/L [phone] 07/04/2014, 5:13 PM

## 2014-07-04 NOTE — Anesthesia Postprocedure Evaluation (Signed)
Anesthesia Post Note  Patient: Kayla [name]  Procedure(s) Performed: Procedure(s) (LRB): RIGHT TOTAL HIP ARTHROPLASTY ANTERIOR APPROACH (Right)  Anesthesia type: general  Patient location: PACU  Post pain: Pain level controlled  Post assessment: Patient's Cardiovascular Status Stable  Last Vitals:  Filed Vitals:   07/04/14 1504  BP: 126/68  Pulse: 53  Temp:   Resp: 11    Post vital signs: Reviewed and stable  Level of consciousness: sedated  Complications: No apparent anesthesia complications

## 2014-07-04 NOTE — Transfer of Care (Signed)
Immediate Anesthesia Transfer of Care Note  Patient: Kayla [name]  Procedure(s) Performed: Procedure(s): RIGHT TOTAL HIP ARTHROPLASTY ANTERIOR APPROACH (Right)  Patient Location: PACU  Anesthesia Type:General  Level of Consciousness: awake and alert   Airway & Oxygen Therapy: Patient Spontanous Breathing and Patient connected to nasal cannula oxygen  Post-op Assessment: Report given to PACU RN, Patient moving all extremities and writhing and moaning in pain; treated as tolerated.  Post vital signs: Reviewed and stable  Complications: No apparent anesthesia complications

## 2014-07-04 NOTE — Anesthesia Procedure Notes (Signed)
Procedure Name: Intubation Date/Time: 07/04/2014 11:51 AM Performed by: Marni GriffonJAMES, Haylea Schlichting B Pre-anesthesia Checklist: Patient identified, Emergency Drugs available, Suction available and Patient being monitored Patient Re-evaluated:Patient Re-evaluated prior to inductionOxygen Delivery Method: Circle system utilized Preoxygenation: Pre-oxygenation with 100% oxygen Intubation Type: IV induction Ventilation: Mask ventilation without difficulty Laryngoscope Size: Mac and 3 Grade View: Grade II Tube type: Oral Tube size: 8.0 mm Number of attempts: 1 Airway Equipment and Method: Stylet Placement Confirmation: ETT inserted through vocal cords under direct vision,  breath sounds checked- equal and bilateral and positive ETCO2 Secured at: 21 (cm at teeth) cm Tube secured with: Tape Dental Injury: Teeth and Oropharynx as per pre-operative assessment

## 2014-07-05 ENCOUNTER — Encounter (HOSPITAL_COMMUNITY): Payer: Self-pay | Admitting: Orthopedic Surgery

## 2014-07-05 NOTE — Progress Notes (Signed)
I participated in the care of this patient and agree with the above history, physical and evaluation. I performed a review of the history and a physical exam as detailed   [name] [name] [name] MD  

## 2014-07-05 NOTE — Progress Notes (Signed)
     Subjective:  Patient reports pain as mild to moderate.  Sitting comfortably in bed.  Not yet out of bed weight bearing.  Discussed plan to work on this with PT today.  No CP or SOB.  Asked about re-starting home BP medications for hypertension.  Discussed re-starting on discharge or if needed while in the hospital.  No reported orthostatic symptoms, but discussed care when sitting up and standing.  No complaints.   Objective:   VITALS:   Filed Vitals:   07/04/14 2059 07/05/14 0000 07/05/14 0400 07/05/14 0515  BP: 110/48   93/46  Pulse: 69   68  Temp: 97.9 F (36.6 C)   99.2 F (37.3 C)  TempSrc: Oral   Oral  Resp: 14 16 16 16   Height:      Weight:      SpO2: 99% 94% 94% 94%    ABD soft Neurovascular intact Sensation intact distally Dorsiflexion/Plantar flexion intact Incision: no drainage   Lab Results  Component Value Date   WBC 7.1 06/22/2014   HGB 15.1* 06/22/2014   HCT 45.1 06/22/2014   MCV 92.8 06/22/2014   PLT 259 06/22/2014     Assessment/Plan: 1 Day Post-Op   Active Problems:   DJD (degenerative joint disease)  WBAT Dry Dressings PRN Advance diet Up with therapy   Haskel KhanDOUGLAS Khalon Cansler 07/05/2014, 7:51 AM   Margarita Ranaimothy Murphy MD [phone]

## 2014-07-05 NOTE — Evaluation (Signed)
Occupational Therapy Evaluation Patient Details Name: Kayla [name] MRN: [id number] DOB: 10/06/1941 Today's Date: 07/05/2014    History of Present Illness pt presents with R anteriorTHA.     Clinical Impression   Pt s/p above. Pt independent with ADLs, PTA. Feel pt will benefit from acute OT to increase independence prior to d/c.     Follow Up Recommendations  No OT follow up;Supervision - Intermittent    Equipment Recommendations  3 in 1 bedside comode    Recommendations for Other Services       Precautions / Restrictions Precautions Precautions: None Restrictions Weight Bearing Restrictions: Yes RLE Weight Bearing: Weight bearing as tolerated      Mobility Bed Mobility Overal bed mobility: Needs Assistance Bed Mobility: Sit to Supine     Sit to supine: Min assist   General bed mobility comments: Assistance with RLE. Explained ways she could assist leg.  Transfers Overall transfer level: Needs assistance Equipment used: Rolling walker (2 wheeled) Transfers: Sit to/from Stand Sit to Stand: Min assist         General transfer comment: cues for technique.         ADL Overall ADL's : Needs assistance/impaired     Grooming: Oral care;Standing;Min guard           Upper Body Dressing : Set up;Sitting   Lower Body Dressing: Minimal assistance;Sit to/from stand   Toilet Transfer: Minimal assistance;Ambulation;RW;BSC   Toileting- Architect and Hygiene: Min guard;Sit to/from stand       Functional mobility during ADLs: Minimal assistance;Rolling walker General ADL Comments: Educated on safety tips for home (rugs, safe shoewear, use of bag on walker). Explained how AE is available if needed. Educated on dressing technique. Pt donned panties. Discussed 3 in 1 and also toilet riser.     Vision                     Perception     Praxis      Pertinent Vitals/Pain Pain Assessment: 0-10 Pain Score: 9  Pain Location: right  hip Pain Descriptors / Indicators: Pressure ("pressure pain") Pain Intervention(s): Repositioned     Hand Dominance     Extremity/Trunk Assessment Upper Extremity Assessment Upper Extremity Assessment: Overall WFL for tasks assessed   Lower Extremity Assessment Lower Extremity Assessment: Defer to PT evaluation RLE Deficits / Details: Limited by post-op pain.   RLE Coordination: decreased fine motor   Cervical / Trunk Assessment Cervical / Trunk Assessment: Normal   Communication Communication Communication: No difficulties   Cognition Arousal/Alertness: Awake/alert Behavior During Therapy: WFL for tasks assessed/performed Overall Cognitive Status: Within Functional Limits for tasks assessed                     General Comments          Shoulder Instructions      Home Living Family/patient expects to be discharged to:: Private residence Living Arrangements: Spouse/significant other Available Help at Discharge: Family;Available PRN/intermittently Type of Home: House Home Access: Stairs to enter [employer] of Steps: 3 Entrance Stairs-Rails: Right Home Layout: One level (has one step down to den)     Bathroom Shower/Tub: Chief Strategy Officer: Standard (has both)     Home Equipment: None          Prior Functioning/Environment Level of Independence: Independent             OT Diagnosis: Acute pain   OT Problem List: Decreased  strength;Decreased activity tolerance;Impaired balance (sitting and/or standing);Decreased knowledge of use of DME or AE;Decreased knowledge of precautions;Pain   OT Treatment/Interventions: Self-care/ADL training;DME and/or AE instruction;Therapeutic activities;Patient/family education;Balance training    OT Goals(Current goals can be found in the care plan section) Acute Rehab OT Goals Patient Stated Goal: not stated OT Goal Formulation: With patient Time For Goal Achievement:  07/12/14 Potential to Achieve Goals: Good ADL Goals Pt Will Perform Lower Body Bathing: with modified independence;sit to/from stand Pt Will Perform Lower Body Dressing: with modified independence;sit to/from stand Pt Will Transfer to Toilet: with modified independence;ambulating (3 in 1 over toilet) Pt Will Perform Toileting - Clothing Manipulation and hygiene: with modified independence;sit to/from stand  OT Frequency: Min 2X/week   Barriers to D/C:            Co-evaluation              End of Session Equipment Utilized During Treatment: Gait belt;Rolling walker  Activity Tolerance: Patient limited by pain Patient left: in bed;with call bell/phone within reach;with family/visitor present   Time: [id number] OT Time Calculation (min): 25 min Charges:  OT General Charges $OT Visit: 1 Procedure OT Evaluation $Initial OT Evaluation Tier I: 1 Procedure OT Treatments $Self Care/Home Management : 8-22 mins G-CodesEarlie [name]:    Kayla [name] OTR/[name] [phone] 07/05/2014, 2:03 PM

## 2014-07-05 NOTE — Progress Notes (Signed)
Utilization review completed.  

## 2014-07-05 NOTE — Evaluation (Signed)
Physical Therapy Evaluation Patient Details Name: Kayla [name] MRN: [id number] DOB: 1941-05-20 Today's Date: 07/05/2014   History of Present Illness  pt presents with R THA.    Clinical Impression  Pt eager for mobility, though is limited by pain today.  Feel pt will continue to make great progress.  Pt interested in OPPT at D/C and feel pt would benefit from OPPT.  Will continue to follow.      Follow Up Recommendations Outpatient PT;Supervision for mobility/OOB    Equipment Recommendations  Rolling walker with 5" wheels;3in1 (PT)    Recommendations for Other Services       Precautions / Restrictions Precautions Precautions: None Restrictions Weight Bearing Restrictions: Yes RLE Weight Bearing: Weight bearing as tolerated      Mobility  Bed Mobility Overal bed mobility: Needs Assistance Bed Mobility: Supine to Sit     Supine to sit: Min assist     General bed mobility comments: cues for sequencing and encouragement.  A with R LE and bringing hips around to EOB.    Transfers Overall transfer level: Needs assistance Equipment used: Rolling walker (2 wheeled) Transfers: Sit to/from Stand Sit to Stand: Min assist         General transfer comment: cues for UE use and positioning of LEs.  cues to control descent to sitting.    Ambulation/Gait Ambulation/Gait assistance: Min guard Ambulation Distance (Feet): 20 Feet Assistive device: Rolling walker (2 wheeled) Gait Pattern/deviations: Step-to pattern;Decreased step length - left;Decreased stance time - right;Decreased stride length;Trunk flexed   Gait velocity interpretation: Below normal speed for age/gender General Gait Details: cues for UE use on RW, upright posture, gait sequencing, and encouragement.  pt moves slowly and limited by pain.    Stairs            Wheelchair Mobility    Modified Rankin (Stroke Patients Only)       Balance Overall balance assessment: Needs assistance Sitting-balance  support: Single extremity supported;Feet supported Sitting balance-Leahy Scale: Poor     Standing balance support: Bilateral upper extremity supported Standing balance-Leahy Scale: Poor                               Pertinent Vitals/Pain Pain Assessment: 0-10 Pain Score: 7  Pain Location: R hip Pain Descriptors / Indicators: Aching Pain Intervention(s): Monitored during session;Premedicated before session;Repositioned    Home Living Family/patient expects to be discharged to:: Private residence Living Arrangements: Spouse/significant other Available Help at Discharge: Family;Available PRN/intermittently Type of Home: House Home Access: Stairs to enter Entrance Stairs-Rails: Right Entrance Stairs-Number of Steps: 3 Home Layout: One level (has one step down to [employer]) Home Equipment: None      Prior Function Level of Independence: Independent               Hand Dominance        Extremity/Trunk Assessment   Upper Extremity Assessment: Defer to OT evaluation           Lower Extremity Assessment: RLE deficits/detail RLE Deficits / Details: Limited by post-op pain.      Cervical / Trunk Assessment: Normal  Communication   Communication: No difficulties  Cognition Arousal/Alertness: Awake/alert Behavior During Therapy: WFL for tasks assessed/performed Overall Cognitive Status: Within Functional Limits for tasks assessed                      General Comments  Exercises Total Joint Exercises Ankle Circles/Pumps: AROM;Both;10 reps Quad Sets: AROM;Both;10 reps      Assessment/Plan    PT Assessment Patient needs continued PT services  PT Diagnosis Abnormality of gait;Acute pain   PT Problem List Decreased strength;Decreased activity tolerance;Decreased balance;Decreased mobility;Decreased knowledge of use of DME;Pain  PT Treatment Interventions DME instruction;Gait training;Stair training;Functional mobility training;Therapeutic  activities;Therapeutic exercise;Balance training;Patient/family education   PT Goals (Current goals can be found in the Care Plan section) Acute Rehab PT Goals Patient Stated Goal: Golfing PT Goal Formulation: With patient Time For Goal Achievement: 07/12/14 Potential to Achieve Goals: Good    Frequency 7X/week   Barriers to discharge        Co-evaluation               End of Session Equipment Utilized During Treatment: Gait belt Activity Tolerance: Patient limited by pain Patient left: in chair;with call bell/phone within reach Nurse Communication: Mobility status         Time: 2956-2130 PT Time Calculation (min): 28 min   Charges:   PT Evaluation $Initial PT Evaluation Tier I: 1 Procedure PT Treatments $Gait Training: 8-22 mins $Therapeutic Activity: 8-22 mins   PT G CodesSunny Schlein, [place] [phone] 07/05/2014, 12:06 PM

## 2014-07-05 NOTE — Progress Notes (Signed)
Physical Therapy Treatment Patient Details Name: Kayla [name] MRN: [id number] DOB: [birth year] Today's Date: 07/05/2014    History of Present Illness pt presents with R THA.      PT Comments    Pt indicates painful this pm and nauseated.  Pt agreeable to there ex at this time.  Spoke with pt about contacting RN when pt is having this increased pain as pt has not had pain meds since before this PT saw her this am.  Will continue to follow.    Follow Up Recommendations  Outpatient PT;Supervision for mobility/OOB     Equipment Recommendations  Rolling walker with 5" wheels;3in1 (PT)    Recommendations for Other Services       Precautions / Restrictions Precautions Precautions: None Restrictions Weight Bearing Restrictions: Yes RLE Weight Bearing: Weight bearing as tolerated    Mobility  Bed Mobility Overal bed mobility: Needs Assistance Bed Mobility: Sit to Supine     Supine to sit: Min assist Sit to supine: Min assist   General bed mobility comments: Assistance with RLE. Explained ways she could assist leg.  Transfers Overall transfer level: Needs assistance Equipment used: Rolling walker (2 wheeled) Transfers: Sit to/from Stand Sit to Stand: Min assist         General transfer comment: cues for technique.  Ambulation/Gait Ambulation/Gait assistance: Min guard Ambulation Distance (Feet): 20 Feet Assistive device: Rolling walker (2 wheeled) Gait Pattern/deviations: Step-to pattern;Decreased step length - left;Decreased stance time - right;Decreased stride length;Trunk flexed   Gait velocity interpretation: Below normal speed for age/gender General Gait Details: cues for UE use on RW, upright posture, gait sequencing, and encouragement.  pt moves slowly and limited by pain.     Stairs            Wheelchair Mobility    Modified Rankin (Stroke Patients Only)       Balance Overall balance assessment: Needs assistance Sitting-balance support:  Single extremity supported;Feet supported Sitting balance-Leahy Scale: Poor     Standing balance support: Bilateral upper extremity supported Standing balance-Leahy Scale: Poor                      Cognition Arousal/Alertness: Awake/alert Behavior During Therapy: WFL for tasks assessed/performed Overall Cognitive Status: Within Functional Limits for tasks assessed                      Exercises Total Joint Exercises Ankle Circles/Pumps: AROM;Both;10 reps Quad Sets: AROM;Both;10 reps Gluteal Sets: AROM;Both;10 reps Short Arc Quad: AROM;Right;10 reps Heel Slides: AAROM;Right;10 reps Hip ABduction/ADduction: AAROM;Right;10 reps Straight Leg Raises: AAROM;Right;10 reps    General Comments        Pertinent Vitals/Pain Pain Assessment: 0-10 Pain Score: 9  Pain Location: R Hip Pain Descriptors / Indicators: Spasm;Sore Pain Intervention(s): Monitored during session;Repositioned;Patient requesting pain meds-RN notified    Home Living Family/patient expects to be discharged to:: Private residence Living Arrangements: Spouse/significant other Available Help at Discharge: Family;Available PRN/intermittently Type of Home: House Home Access: Stairs to enter Entrance Stairs-Rails: Right Home Layout: One level (has one step down to den) Home Equipment: None      Prior Function Level of Independence: Independent          PT Goals (current goals can now be found in the care plan section) Acute Rehab PT Goals Patient Stated Goal: Golfing PT Goal Formulation: With patient Time For Goal Achievement: 07/12/14 Potential to Achieve Goals: Good Progress towards PT goals: Progressing toward goals  Frequency  7X/week    PT Plan Current plan remains appropriate    Co-evaluation             End of Session Equipment Utilized During Treatment: Gait belt Activity Tolerance: Patient limited by pain Patient left: in bed;with call bell/phone within reach      Time: 1349-1410 PT Time Calculation (min): 21 min  Charges:  $Gait Training: 8-22 mins $Therapeutic Exercise: 8-22 mins $Therapeutic Activity: 8-22 mins                    G CodesSunny Schlein, [name] [phone] 07/05/2014, 2:19 PM

## 2014-07-06 ENCOUNTER — Encounter: Payer: Medicare PPO | Admitting: Internal Medicine

## 2014-07-06 LAB — URINE CULTURE
CULTURE: NO GROWTH
Colony Count: NO GROWTH

## 2014-07-06 NOTE — Progress Notes (Signed)
Physical Therapy Note   07/06/14 1200  PT Visit Information  Last PT Received On 07/06/14  Assistance Needed +1  History of Present Illness pt presents with R THA.    PT Time Calculation  PT Start Time 1100  PT Stop Time 1122  PT Time Calculation (min) 22 min  Subjective Data  Patient Stated Goal Golfing  Precautions  Precautions None  Restrictions  Weight Bearing Restrictions Yes  RLE Weight Bearing WBAT  Pain Assessment  Pain Assessment 0-10  Pain Score 7  Pain Location R Hip  Pain Descriptors / Indicators Spasm;Sore  Pain Intervention(s) Premedicated before session;Repositioned  Cognition  Arousal/Alertness Awake/alert  Behavior During Therapy WFL for tasks assessed/performed  Overall Cognitive Status Within Functional Limits for tasks assessed  Transfers  Overall transfer level Needs assistance  Equipment used Rolling walker (2 wheeled)  Transfers Sit to/from Stand  Sit to Stand Min guard  General transfer comment Demos good technique.    Ambulation/Gait  Ambulation/Gait assistance Min guard  Ambulation Distance (Feet) 15 Feet (x2)  Assistive device Rolling walker (2 wheeled)  Gait Pattern/deviations Step-to pattern;Decreased step length - left;Decreased stance time - right;Decreased stride length;Trunk flexed  Gait velocity interpretation Below normal speed for age/gender  General Gait Details cues for upright posture and encouragement.    Stairs Yes  Stairs assistance Min guard  Stair Management One rail Left;Step to pattern;Forwards  Number of Stairs 6  General stair comments cues for sequencing, use of rail, and encouragement.    PT - End of Session  Equipment Utilized During Treatment Gait belt  Activity Tolerance Patient limited by pain  Patient left in chair (wit OT)  Nurse Communication Mobility status  PT - Assessment/Plan  PT Plan Current plan remains appropriate  PT Frequency 7X/week  Follow Up Recommendations Outpatient PT;Supervision for  mobility/OOB  PT equipment Rolling walker with 5" wheels;3in1 (PT)  PT Goal Progression  Progress towards PT goals Progressing toward goals  Acute Rehab PT Goals  PT Goal Formulation With patient  Time For Goal Achievement 07/12/14  Potential to Achieve Goals Good  PT General Charges  $$ ACUTE PT VISIT 1 Procedure  PT Treatments  $Gait Training 8-22 mins   [place] Merr[place] Island[place]egan Amaree Loisel, PT [phone]

## 2014-07-06 NOTE — Progress Notes (Signed)
I participated in the care of this patient and agree with the above history, physical and evaluation. I performed a review of the history and a physical exam as detailed   Raife Lizer Daniel Sadik Piascik MD  

## 2014-07-06 NOTE — Progress Notes (Signed)
Patient ID: Kayla [name], female   DOB: 1941/09/22, 73 y.o.   MRN: [id number]     Subjective:  Patient reports pain as mild.  Patient doing better and denies any CP or SOB  Objective:   VITALS:   Filed Vitals:   07/05/14 0515 07/05/14 1239 07/05/14 2100 07/06/14 0500  BP: 93/46 111/53 103/51 105/55  Pulse: 68 67 87 68  Temp: 99.2 F (37.3 C) 98.1 F (36.7 C) 98.5 F (36.9 C) 98 F (36.7 C)  TempSrc: Oral  Oral Oral  Resp: 16  18 20   Height:      Weight:      SpO2: 94% 92% 95% 94%    ABD soft Sensation intact distally Dorsiflexion/Plantar flexion intact Incision: dressing C/D/I and no drainage   Lab Results  Component Value Date   WBC 7.1 06/22/2014   HGB 15.1* 06/22/2014   HCT 45.1 06/22/2014   MCV 92.8 06/22/2014   PLT 259 06/22/2014     Assessment/Plan: 2 Days Post-Op   Active Problems:   DJD (degenerative joint disease)   Advance diet Up with therapy Discharge home with home health   Haskel KhanDOUGLAS Traeton Bordas 07/06/2014, 9:31 AM   Margarita Ranaimothy Murphy MD [phone]

## 2014-07-06 NOTE — Progress Notes (Signed)
Pt given discharge instructions and scripts; Pt verbalized understanding of D/C teachings. Pt left floor via wheelchair with spouse and belongings

## 2014-07-06 NOTE — Progress Notes (Addendum)
Occupational Therapy Treatment and Discharge Patient Details Name: Kayla [name] MRN: [id number] DOB: 1941-03-13 Today's Date: 07/06/2014    History of present illness pt presents with R THA.     OT comments  This 73 yo female admitted and underwent above presents to acute OT with all education completed, we will D/C from acute OT.  Follow Up Recommendations  No OT follow up;Supervision - Intermittent    Equipment Recommendations  3 in 1 bedside comode       Precautions / Restrictions Precautions Precautions: None Restrictions Weight Bearing Restrictions: Yes RLE Weight Bearing: Weight bearing as tolerated       Mobility Bed Mobility Overal bed mobility: Needs Assistance Bed Mobility: Supine to Sit     Supine to sit: Supervision        Transfers Overall transfer level: Needs assistance Equipment used: Rolling walker (2 wheeled) Transfers: Sit to/from Stand Sit to Stand: Min guard                ADL Overall ADL's : Needs assistance/impaired     Grooming: Wash/dry hands;Min guard;Standing               Lower Body Dressing: Minimal assistance;Sit to/from stand   Toilet Transfer: Min guard;Ambulation;RW;Comfort height toilet;Grab bars   Toileting- Clothing Manipulation and Hygiene: Min guard;Sit to/from stand         General ADL Comments: Husband is going to A pt with BADLs until she can do them for herself. I went over the sequencing of dressing that would be more enery efficient with pt and husband. I demonstrated to pt and husband how she would need to do a tub/shower transfer to 3n1 once she is cleared by doctor to get her leg wet and als gave them a handout with pics about this.                Cognition   Behavior During Therapy: WFL for tasks assessed/performed Overall Cognitive Status: Within Functional Limits for tasks assessed                                    Pertinent Vitals/ Pain       Pain Assessment:  0-10 Pain Score: 7  Pain Location: Rhip Pain Descriptors / Indicators: Spasm;Sore Pain Intervention(s): Premedicated before session;Repositioned;Patient requesting pain meds-RN notified         Frequency Min 2X/week     Progress Toward Goals  OT Goals(current goals can now be found in the care plan section)  Progress towards OT goals:  (All education completed)  Acute Rehab OT Goals Patient Stated Goal: Golfing  Plan Discharge plan remains appropriate       End of Session Equipment Utilized During Treatment: Gait belt;Rolling walker   Activity Tolerance Patient tolerated treatment well   Patient Left in chair;with call bell/phone within reach;with family/visitor present   Nurse Communication  (Pt ready to go from therapy standpoint once she finishes lunch)        Time: [id number] OT Time Calculation (min): 27 min  Charges: OT General Charges $OT Visit: 1 Procedure OT Treatments $Self Care/Home Management : 23-37 mins  Evette GeorgesLeonard, Ajdin Macke Eva [phone] 07/06/2014, 12:33 PM

## 2014-07-06 NOTE — Discharge Summary (Signed)
Physician Discharge Summary  Patient ID: Kayla Ageelaine V Champagne MRN: [id number] DOB/AGE: 02-22-1941 73 y.o.  Admit date: 07/04/2014 Discharge date: 07/06/2014  Admission Diagnoses:  <principal problem not specified>  Discharge Diagnoses:  Active Problems:   DJD (degenerative joint disease)   Past Medical History  Diagnosis Date  . CHEST WALL PAIN, ANTERIOR 07/26/2008  . HYPERLIPIDEMIA 06/01/2008  . HYPERTENSION 07/28/2007  . MENOPAUSAL SYNDROME 06/01/2008    Surgeries: Procedure(s): RIGHT TOTAL HIP ARTHROPLASTY ANTERIOR APPROACH on 07/04/2014   Consultants (if any):    Discharged Condition: Improved  Hospital Course: Kayla [name] is an 73 y.o. female who was admitted 07/04/2014 with a diagnosis of <principal problem not specified> and went to the operating room on 07/04/2014 and underwent the above named procedures.    She was given perioperative antibiotics:  Anti-infectives   Start     Dose/Rate Route Frequency Ordered Stop   07/04/14 1800  ceFAZolin (ANCEF) IVPB 2 g/50 mL premix     2 g 100 mL/hr over 30 Minutes Intravenous Every 6 hours 07/04/14 1600 07/05/14 0103   07/04/14 0600  ceFAZolin (ANCEF) IVPB 2 g/50 mL premix     2 g 100 mL/hr over 30 Minutes Intravenous On call to O.R. 07/03/14 1422 07/04/14 1155    .  She was given sequential compression devices, early ambulation, and ASA 325 for DVT prophylaxis.  She benefited maximally from the hospital stay and there were no complications.    Recent vital signs:  Filed Vitals:   07/06/14 0500  BP: 105/55  Pulse: 68  Temp: 98 F (36.7 C)  Resp: 20    Recent laboratory studies:  Lab Results  Component Value Date   HGB 15.1* 06/22/2014   HGB 15.4* 07/28/2013   HGB 15.1* 07/25/2011   Lab Results  Component Value Date   WBC 7.1 06/22/2014   PLT 259 06/22/2014   Lab Results  Component Value Date   INR 0.97 06/22/2014   Lab Results  Component Value Date   NA 144 06/22/2014   K 4.2 06/22/2014   CL 107 06/22/2014   CO2 25  06/22/2014   BUN 16 06/22/2014   CREATININE 0.69 06/22/2014   GLUCOSE 94 06/22/2014    Discharge Medications:     Medication List    STOP taking these medications       aspirin 81 MG tablet  Replaced by:  aspirin EC 325 MG tablet      TAKE these medications       aspirin EC 325 MG tablet  Take 1 tablet (325 mg total) by mouth daily.     celecoxib 200 MG capsule  Commonly known as:  CELEBREX  Take 1 capsule (200 mg total) by mouth 2 (two) times daily.     docusate sodium 100 MG capsule  Commonly known as:  COLACE  Take 1 capsule (100 mg total) by mouth 2 (two) times daily. Continue this while taking narcotics to help with bowel movements     hydrochlorothiazide 25 MG tablet  Commonly known as:  HYDRODIURIL  Take 1 tablet (25 mg total) by mouth daily.     HYDROcodone-acetaminophen 5-325 MG per tablet  Commonly known as:  NORCO  Take 1-2 tablets by mouth every 4 (four) hours as needed for moderate pain.     methocarbamol 500 MG tablet  Commonly known as:  ROBAXIN  Take 1 tablet (500 mg total) by mouth every 6 (six) hours as needed.     multivitamin with minerals  Tabs tablet  Take 1 tablet by mouth daily.     ondansetron 4 MG tablet  Commonly known as:  ZOFRAN  Take 1 tablet (4 mg total) by mouth every 8 (eight) hours as needed for nausea.        Diagnostic Studies: Dg Chest 2 View  06/22/2014   CLINICAL DATA:  Preop for total hip arthroplasty.  EXAM: CHEST  2 VIEW  COMPARISON:  None.  FINDINGS: Cardiac silhouette is normal in size. Aorta is tortuous. No mediastinal or hilar masses. No evidence of adenopathy.  Lungs are mildly hyperexpanded but clear. No pleural effusion. No pneumothorax.  Bony thorax is demineralized but grossly intact.  IMPRESSION: No acute cardiopulmonary disease.   Electronically Signed   By: Amie Portland M.D.   On: 06/22/2014 08:52   Dg Pelvis Portable  07/04/2014   CLINICAL DATA:  Postop  EXAM: PORTABLE PELVIS 1-2 VIEWS  COMPARISON:  06/02/2014   FINDINGS: Changes of right hip replacement. Normal AP alignment. No hardware or bony complicating feature. No acute bony abnormality.  IMPRESSION: Right hip replacement.  No complicating feature.   Electronically Signed   By: Charlett Nose M.D.   On: 07/04/2014 14:34    Disposition: Final discharge disposition not confirmed        Follow-up Information   Follow up with Sheral Apley, MD. (as scheduled)    Specialty:  Orthopedic Surgery   Contact information:   [address] [place] [address] [phone]        Signed: Margarita Rana, D 07/06/2014, 7:34 AM

## 2014-07-06 NOTE — Care Management Note (Signed)
CARE MANAGEMENT NOTE 07/06/2014  Patient:  Kayla [name],Kayla [name]   Account Number:  [id number]  Date Initiated:  07/06/2014  Documentation initiated by:  Vance PeperBRADY,Saman Giddens  Subjective/Objective Assessment:   73 yr old female, s/p right total hip arthroplasty.     Action/Plan:   [facility], no changes, has family support at discharge.   Anticipated DC Date:  07/06/2014   Anticipated DC Plan:  HOME W HOME HEALTH SERVICES      DC Planning Services  CM consult      [facility]  [facility]ALTH  DURABLE MEDICAL EQUIPMENT   Choice offered to / List presented to:  C-1 Patient   DME arranged  3-N-1  [facility]      DME agency  [employer]     HH arranged  HH-2 PT      [facility]   Status of service:  Completed, signed off Medicare Important Message given?  NA - LOS <3 / Initial given by admissions (If response is "NO", the following Medicare IM given date fields will be blank) Date Medicare IM given:   Medicare IM given by:   Date Additional Medicare IM given:   Additional Medicare IM given by:    Discharge Disposition:  HOME W HOME HEALTH SERVICES  Per UR Regulation:  Reviewed for med. necessity/level of care/duration of stay

## 2014-07-19 ENCOUNTER — Encounter: Payer: Medicare PPO | Admitting: Internal Medicine

## 2014-08-01 ENCOUNTER — Ambulatory Visit: Payer: Medicare PPO | Attending: Orthopedic Surgery

## 2014-08-01 ENCOUNTER — Encounter: Payer: Medicare PPO | Admitting: Internal Medicine

## 2014-08-01 DIAGNOSIS — Z96659 Presence of unspecified artificial knee joint: Secondary | ICD-10-CM | POA: Insufficient documentation

## 2014-08-01 DIAGNOSIS — IMO0001 Reserved for inherently not codable concepts without codable children: Secondary | ICD-10-CM | POA: Diagnosis present

## 2014-08-01 DIAGNOSIS — M6281 Muscle weakness (generalized): Secondary | ICD-10-CM | POA: Insufficient documentation

## 2014-08-01 DIAGNOSIS — R262 Difficulty in walking, not elsewhere classified: Secondary | ICD-10-CM | POA: Diagnosis not present

## 2014-08-01 DIAGNOSIS — M25559 Pain in unspecified hip: Secondary | ICD-10-CM | POA: Insufficient documentation

## 2014-08-14 ENCOUNTER — Ambulatory Visit: Payer: Medicare PPO

## 2014-08-15 ENCOUNTER — Ambulatory Visit: Payer: Medicare PPO

## 2014-08-15 DIAGNOSIS — IMO0001 Reserved for inherently not codable concepts without codable children: Secondary | ICD-10-CM | POA: Diagnosis not present

## 2014-08-17 ENCOUNTER — Ambulatory Visit: Payer: Medicare PPO | Attending: Orthopedic Surgery | Admitting: Physical Therapy

## 2014-08-17 ENCOUNTER — Ambulatory Visit (INDEPENDENT_AMBULATORY_CARE_PROVIDER_SITE_OTHER): Payer: Medicare PPO

## 2014-08-17 DIAGNOSIS — Z96641 Presence of right artificial hip joint: Secondary | ICD-10-CM | POA: Insufficient documentation

## 2014-08-17 DIAGNOSIS — M6281 Muscle weakness (generalized): Secondary | ICD-10-CM | POA: Diagnosis not present

## 2014-08-17 DIAGNOSIS — M25551 Pain in right hip: Secondary | ICD-10-CM | POA: Diagnosis not present

## 2014-08-17 DIAGNOSIS — R262 Difficulty in walking, not elsewhere classified: Secondary | ICD-10-CM | POA: Insufficient documentation

## 2014-08-17 DIAGNOSIS — Z23 Encounter for immunization: Secondary | ICD-10-CM

## 2014-08-17 DIAGNOSIS — Z5189 Encounter for other specified aftercare: Secondary | ICD-10-CM | POA: Insufficient documentation

## 2014-08-22 ENCOUNTER — Ambulatory Visit: Payer: Medicare PPO

## 2014-08-22 DIAGNOSIS — Z5189 Encounter for other specified aftercare: Secondary | ICD-10-CM | POA: Diagnosis not present

## 2014-08-24 ENCOUNTER — Ambulatory Visit: Payer: Medicare PPO

## 2014-08-24 DIAGNOSIS — Z5189 Encounter for other specified aftercare: Secondary | ICD-10-CM | POA: Diagnosis not present

## 2014-08-29 ENCOUNTER — Ambulatory Visit: Payer: Medicare PPO | Admitting: Physical Therapy

## 2014-08-30 ENCOUNTER — Ambulatory Visit: Payer: Medicare PPO | Admitting: Physical Therapy

## 2014-08-30 DIAGNOSIS — Z5189 Encounter for other specified aftercare: Secondary | ICD-10-CM | POA: Diagnosis not present

## 2014-08-31 ENCOUNTER — Ambulatory Visit: Payer: Medicare PPO

## 2014-08-31 DIAGNOSIS — Z5189 Encounter for other specified aftercare: Secondary | ICD-10-CM | POA: Diagnosis not present

## 2014-09-05 ENCOUNTER — Ambulatory Visit: Payer: Medicare PPO | Admitting: Physical Therapy

## 2014-09-05 DIAGNOSIS — Z5189 Encounter for other specified aftercare: Secondary | ICD-10-CM | POA: Diagnosis not present

## 2014-09-06 ENCOUNTER — Ambulatory Visit: Payer: Medicare PPO

## 2014-09-06 DIAGNOSIS — Z5189 Encounter for other specified aftercare: Secondary | ICD-10-CM | POA: Diagnosis not present

## 2014-09-07 ENCOUNTER — Telehealth: Payer: Self-pay | Admitting: Internal Medicine

## 2014-09-07 NOTE — Telephone Encounter (Signed)
Caller: Parish/Patient; Phone: [phone]; Reason for Call: Has been noticing her hair thinning for the last 4 months.  Is on HCTZ and read that it could be a side effect.  She wants to know if Dr. Kirtland BouchardK wants to decrease her dose or try her on another med.  PLEASE ADVISE.

## 2014-09-07 NOTE — Telephone Encounter (Signed)
Left message on voicemail to call office.  

## 2014-09-07 NOTE — Telephone Encounter (Signed)
Notify patient that hydrochlorothiazide is a doubtful cause of her hair loss; Okay to discontinue hydrochlorothiazide with home but pressure monitoring; notify office if blood pressures consistently greater than 140 over 90

## 2014-09-07 NOTE — Telephone Encounter (Signed)
Please see message and advise 

## 2014-09-07 NOTE — Telephone Encounter (Signed)
Pt called back, told her that hydrochlorothiazide is a doubtful cause of her hair loss; Okay to discontinue hydrochlorothiazide, but monitor blood pressure at home and  notify office if blood pressures consistently greater than 140 over 90 for 2-3 days per Dr. Kirtland BouchardK. Pt verbalized understanding.

## 2014-10-06 ENCOUNTER — Ambulatory Visit: Payer: Medicare PPO | Admitting: Physical Therapy

## 2014-10-09 ENCOUNTER — Ambulatory Visit: Payer: Medicare PPO | Attending: Orthopedic Surgery

## 2014-10-09 DIAGNOSIS — M6281 Muscle weakness (generalized): Secondary | ICD-10-CM | POA: Diagnosis not present

## 2014-10-09 DIAGNOSIS — M25551 Pain in right hip: Secondary | ICD-10-CM | POA: Diagnosis not present

## 2014-10-09 DIAGNOSIS — Z96641 Presence of right artificial hip joint: Secondary | ICD-10-CM | POA: Diagnosis not present

## 2014-10-09 DIAGNOSIS — Z5189 Encounter for other specified aftercare: Secondary | ICD-10-CM | POA: Insufficient documentation

## 2014-10-09 DIAGNOSIS — R262 Difficulty in walking, not elsewhere classified: Secondary | ICD-10-CM | POA: Insufficient documentation

## 2014-10-10 ENCOUNTER — Ambulatory Visit: Payer: Medicare PPO

## 2015-03-30 ENCOUNTER — Other Ambulatory Visit (INDEPENDENT_AMBULATORY_CARE_PROVIDER_SITE_OTHER): Payer: Medicare PPO

## 2015-03-30 DIAGNOSIS — E785 Hyperlipidemia, unspecified: Secondary | ICD-10-CM | POA: Diagnosis not present

## 2015-03-30 DIAGNOSIS — Z0000 Encounter for general adult medical examination without abnormal findings: Secondary | ICD-10-CM

## 2015-03-30 LAB — HEPATIC FUNCTION PANEL
ALT: 28 U/L (ref 0–35)
AST: 26 U/L (ref 0–37)
Albumin: 4.1 g/dL (ref 3.5–5.2)
Alkaline Phosphatase: 104 U/L (ref 39–117)
BILIRUBIN DIRECT: 0.1 mg/dL (ref 0.0–0.3)
BILIRUBIN TOTAL: 0.6 mg/dL (ref 0.2–1.2)
Total Protein: 7.6 g/dL (ref 6.0–8.3)

## 2015-03-30 LAB — TSH: TSH: 1.95 u[IU]/mL (ref 0.35–4.50)

## 2015-03-30 LAB — POCT URINALYSIS DIPSTICK
Bilirubin, UA: NEGATIVE
GLUCOSE UA: NEGATIVE
Ketones, UA: NEGATIVE
Nitrite, UA: NEGATIVE
PROTEIN UA: NEGATIVE
Spec Grav, UA: 1.02
Urobilinogen, UA: 0.2
pH, UA: 5.5

## 2015-03-30 LAB — CBC WITH DIFFERENTIAL/PLATELET
Basophils Absolute: 0.1 10*3/uL (ref 0.0–0.1)
Basophils Relative: 0.7 % (ref 0.0–3.0)
EOS ABS: 0.2 10*3/uL (ref 0.0–0.7)
Eosinophils Relative: 2.8 % (ref 0.0–5.0)
HCT: 45 % (ref 36.0–46.0)
Hemoglobin: 15.3 g/dL — ABNORMAL HIGH (ref 12.0–15.0)
LYMPHS PCT: 25.5 % (ref 12.0–46.0)
Lymphs Abs: 1.9 10*3/uL (ref 0.7–4.0)
MCHC: 34.1 g/dL (ref 30.0–36.0)
MCV: 90.1 fl (ref 78.0–100.0)
Monocytes Absolute: 0.7 10*3/uL (ref 0.1–1.0)
Monocytes Relative: 8.8 % (ref 3.0–12.0)
NEUTROS ABS: 4.7 10*3/uL (ref 1.4–7.7)
NEUTROS PCT: 62.2 % (ref 43.0–77.0)
Platelets: 251 10*3/uL (ref 150.0–400.0)
RBC: 4.99 Mil/uL (ref 3.87–5.11)
RDW: 13.9 % (ref 11.5–15.5)
WBC: 7.5 10*3/uL (ref 4.0–10.5)

## 2015-03-30 LAB — BASIC METABOLIC PANEL
BUN: 16 mg/dL (ref 6–23)
CHLORIDE: 104 meq/L (ref 96–112)
CO2: 24 meq/L (ref 19–32)
Calcium: 10.3 mg/dL (ref 8.4–10.5)
Creatinine, Ser: 0.77 mg/dL (ref 0.40–1.20)
GFR: 77.73 mL/min (ref >60.00)
GLUCOSE: 89 mg/dL (ref 70–99)
Potassium: 3.6 mEq/L (ref 3.5–5.1)
Sodium: 137 mEq/L (ref 135–145)

## 2015-03-30 LAB — LIPID PANEL
CHOLESTEROL: 245 mg/dL — AB (ref 0–200)
HDL: 52.7 mg/dL (ref >39.00)
NONHDL: 192.3
Total CHOL/HDL Ratio: 5
Triglycerides: 260 mg/dL — ABNORMAL HIGH (ref 0.0–149.0)
VLDL: 52 mg/dL — ABNORMAL HIGH (ref 0.0–40.0)

## 2015-03-30 LAB — LDL CHOLESTEROL, DIRECT: Direct LDL: 160 mg/dL

## 2015-04-05 ENCOUNTER — Encounter: Payer: Medicare PPO | Admitting: Internal Medicine

## 2015-04-09 ENCOUNTER — Ambulatory Visit (INDEPENDENT_AMBULATORY_CARE_PROVIDER_SITE_OTHER): Payer: Medicare PPO | Admitting: Internal Medicine

## 2015-04-09 ENCOUNTER — Encounter: Payer: Self-pay | Admitting: Internal Medicine

## 2015-04-09 VITALS — BP 140/90 | HR 120 | Temp 98.0°F | Resp 20 | Ht 62.0 in | Wt 148.0 lb

## 2015-04-09 DIAGNOSIS — I10 Essential (primary) hypertension: Secondary | ICD-10-CM | POA: Diagnosis not present

## 2015-04-09 DIAGNOSIS — M150 Primary generalized (osteo)arthritis: Secondary | ICD-10-CM

## 2015-04-09 DIAGNOSIS — R000 Tachycardia, unspecified: Secondary | ICD-10-CM

## 2015-04-09 DIAGNOSIS — Z0000 Encounter for general adult medical examination without abnormal findings: Secondary | ICD-10-CM | POA: Diagnosis not present

## 2015-04-09 DIAGNOSIS — M159 Polyosteoarthritis, unspecified: Secondary | ICD-10-CM

## 2015-04-09 DIAGNOSIS — E785 Hyperlipidemia, unspecified: Secondary | ICD-10-CM

## 2015-04-09 NOTE — Progress Notes (Signed)
Patient ID: Kayla [name], female   DOB: Jul 12, 1941, 74 y.o.   MRN: [id number] Patient ID: Kayla [name], female   DOB: Jul 12, 1941, 74 y.o.   MRN: [id number]  Subjective:    Patient ID: Kayla [name], female    DOB: Jul 12, 1941, 74 y.o.   MRN: [id number]  Hypertension Pertinent negatives include no chest pain, headaches, palpitations or shortness of breath.    History of Present Illness:   74   year-old who is in today for a wellness exam.  She has a history of treated hypertension.  She denies any cardiopulmonary complaints.  She is status post right total hip replacement surgery in August 2015.  She has been exercising regularly but has not returned to full activity and is not yet planning golf.  She is making some nice progress.  However.  Due to some hair loss.  Hydrochlorothiazide has been on hold    BP Readings from Last 3 Encounters:  04/09/15 140/90  07/06/14 105/55  06/02/14 120/80    Wt Readings from Last 3 Encounters:  07/04/14 147 lb (66.679 kg)  06/02/14 152 lb (68.947 kg)  07/28/13 147 lb (66.679 kg)     Past Medical History  Diagnosis Date  . CHEST WALL PAIN, ANTERIOR 07/26/2008  . HYPERLIPIDEMIA 06/01/2008  . HYPERTENSION 07/28/2007  . MENOPAUSAL SYNDROME 06/01/2008    History   Social History  . Marital Status: Married    Spouse Name: N/A  . Number of Children: N/A  . Years of Education: N/A   Occupational History  . Not on file.   Social History Main Topics  . Smoking status: Never Smoker   . Smokeless tobacco: Never Used  . Alcohol Use: Yes     Comment: rare  . Drug Use: No  . Sexual Activity: Not on file   Other Topics Concern  . Not on file   Social History Narrative  . No narrative on file    Past Surgical History  Procedure Laterality Date  . Colonoscopy    . No past surgeries    . Total hip arthroplasty Right 07/04/2014    Procedure: RIGHT TOTAL HIP ARTHROPLASTY ANTERIOR APPROACH;  Surgeon: Sheral Apleyimothy D Murphy, MD;  Location: [facility] OR;   Service: Orthopedics;  Laterality: Right;    Family History  Problem Relation Age of Onset  . Cancer Mother     lung ca  . Heart disease Father     No Known Allergies  Current Outpatient Prescriptions on File Prior to Visit  Medication Sig Dispense Refill  . aspirin EC 325 MG tablet Take 1 tablet (325 mg total) by mouth daily. 30 tablet 0  . celecoxib (CELEBREX) 200 MG capsule Take 1 capsule (200 mg total) by mouth 2 (two) times daily. 60 capsule 0  . docusate sodium (COLACE) 100 MG capsule Take 1 capsule (100 mg total) by mouth 2 (two) times daily. Continue this while taking narcotics to help with bowel movements 30 capsule 1  . hydrochlorothiazide (HYDRODIURIL) 25 MG tablet Take 1 tablet (25 mg total) by mouth daily. 120 tablet 5  . HYDROcodone-acetaminophen (NORCO) 5-325 MG per tablet Take 1-2 tablets by mouth every 4 (four) hours as needed for moderate pain. 90 tablet 0  . methocarbamol (ROBAXIN) 500 MG tablet Take 1 tablet (500 mg total) by mouth every 6 (six) hours as needed. 40 tablet 1  . Multiple Vitamin (MULTIVITAMIN WITH MINERALS) TABS tablet Take 1 tablet by mouth daily.    . ondansetron (ZOFRAN)  4 MG tablet Take 1 tablet (4 mg total) by mouth every 8 (eight) hours as needed for nausea. 60 tablet 0   No current facility-administered medications on file prior to visit.    There were no vitals taken for this visit.    Here for Medicare AWV:   1. Risk factors based on Past M, S, F history:cardiovascular risk factors include a history of hypertension  2. Physical Activities: remains quite active with walking daily and also  golf  3. Depression/mood: no history of depression or mood disorder  4. Hearing: no deficits  5. ADL's: independent in all aspects of daily living  6. Fall Risk: low  7. Home Safety: no problems identified  8. Height, weight, &visual acuity:height and weight stable. No prior bone density study. No difficulty with visual acuity  9. Counseling: heart  healthy diet. Discussed  10. Labs ordered based on risk factors: laboratory studies will be reviewed, including lipid profile  11. Referral Coordination- may require orthopedic referral if symptoms refractory to short-term anti-inflammatory medication  12. Care Plan- bone density study ordered  13. Cognitive Assessment- alert and oriented, with normal affect and memory dysfunction, able to perform all executive functions. No functional limitations  14.  Preventive services will include colonoscopies on a 2 year interval.  She will have annual health examinations as well as annual eye examinations.  Mammograms.  Encouraged every 1 or 2 years 15.  Provider list up-to-date includes primary care medicine, orthopedics, ophthalmology.  Current Medications (verified):  1) Aspirin 81 Mg Tbec (Aspirin) .... Take 1 Tablet By Mouth Every Morning  2) Hydrochlorothiazide 25 Mg Tabs (Hydrochlorothiazide) .... Take 1 Tablet By Mouth Once A Day  3) Multivitamins Tabs (Multiple Vitamin) .... Once Daily  4) Caltrate 600+d 600-400 Mg-Unit Tabs (Calcium Carbonate-Vitamin D) .... Qd   Allergies (verified):  No Known Drug Allergies   Past History:  Past Medical History:   Hypertension  gravida 3, para 3, abortus zero  Hyperlipidemia  menopausal syndrome   Past Surgical History:   colonoscopy May 2007  Right hip surgery 2015  Family History:   father died age 26, heart failure  mother died age 23, lung cancer  one brother is well   Social History:   Married  Never Smoked  Regular exercise-yes      Review of Systems  Constitutional: Negative for fever, appetite change, fatigue and unexpected weight change.  HENT: Negative for congestion, dental problem, ear pain, hearing loss, mouth sores, nosebleeds, sinus pressure, sore throat, tinnitus, trouble swallowing and voice change.   Eyes: Negative for photophobia, pain, redness and visual disturbance.  Respiratory: Negative for cough, chest  tightness and shortness of breath.   Cardiovascular: Negative for chest pain, palpitations and leg swelling.  Gastrointestinal: Negative for nausea, vomiting, abdominal pain, diarrhea, constipation, blood in stool, abdominal distention and rectal pain.  Genitourinary: Negative for dysuria, urgency, frequency, hematuria, flank pain, vaginal bleeding, vaginal discharge, difficulty urinating, genital sores, vaginal pain, menstrual problem and pelvic pain.  Musculoskeletal: Negative for back pain, arthralgias and neck stiffness.  Skin: Negative for rash.  Neurological: Negative for dizziness, syncope, speech difficulty, weakness, light-headedness, numbness and headaches.  Hematological: Negative for adenopathy. Does not bruise/bleed easily.  Psychiatric/Behavioral: Negative for suicidal ideas, behavioral problems, self-injury, dysphoric mood and agitation. The patient is not nervous/anxious.        Objective:   Physical Exam  Constitutional: She is oriented to person, place, and time. She appears well-developed and well-nourished.  HENT:  Head: Normocephalic and atraumatic.  Right Ear: External ear normal.  Left Ear: External ear normal.  Mouth/Throat: Oropharynx is clear and moist.  Eyes: Conjunctivae and EOM are normal.  Neck: Normal range of motion. Neck supple. No JVD present. No thyromegaly present.  Cardiovascular: Normal rate, regular rhythm, normal heart sounds and intact distal pulses.   No murmur heard. Pulmonary/Chest: Effort normal and breath sounds normal. She has no wheezes. She has no rales.  Abdominal: Soft. Bowel sounds are normal. She exhibits no distension and no mass. There is no tenderness. There is no rebound and no guarding.  Musculoskeletal: Normal range of motion. She exhibits no edema or tenderness.  Neurological: She is alert and oriented to person, place, and time. She has normal reflexes. No cranial nerve deficit. She exhibits normal muscle tone. Coordination  normal.  Skin: Skin is warm and dry. No rash noted.  Psychiatric: She has a normal mood and affect. Her behavior is normal.          Assessment & Plan:    Preventive health Hypertension stable  menopausal syndrome  Screening mammogram encouraged Recheck 1 year Will review screening lab We'll continue to hold the hydrochlorothiazide.  Efforts at weight loss and more activity.  Recommended.  Will continue home blood pressure monitoring  Patient had tachycardia initially with rates as high as 120.  EKG confirmed a sinus tachycardia with rate down to 105.  EKG otherwise normal

## 2015-04-09 NOTE — Patient Instructions (Signed)
Limit your sodium (Salt) intake  Please check your blood pressure on a regular basis.  If it is consistently greater than 150/90, please make an office appointment.    It is important that you exercise regularly, at least 20 minutes 3 to 4 times per week.  If you develop chest pain or shortness of breath seek  medical attention.  You need to lose weight.  Consider a lower calorie diet and regular exercise.  Return in 6 months for follow-up   Health Maintenance Adopting a healthy lifestyle and getting preventive care can go a long way to promote health and wellness. Talk with your health care provider about what schedule of regular examinations is right for you. This is a good chance for you to check in with your provider about disease prevention and staying healthy. In between checkups, there are plenty of things you can do on your own. Experts have done a lot of research about which lifestyle changes and preventive measures are most likely to keep you healthy. Ask your health care provider for more information. WEIGHT AND DIET  Eat a healthy diet  Be sure to include plenty of vegetables, fruits, low-fat dairy products, and lean protein.  Do not eat a lot of foods high in solid fats, added sugars, or salt.  Get regular exercise. This is one of the most important things you can do for your health.  Most adults should exercise for at least 150 minutes each week. The exercise should increase your heart rate and make you sweat (moderate-intensity exercise).  Most adults should also do strengthening exercises at least twice a week. This is in addition to the moderate-intensity exercise.  Maintain a healthy weight  Body mass index (BMI) is a measurement that can be used to identify possible weight problems. It estimates body fat based on height and weight. Your health care provider can help determine your BMI and help you achieve or maintain a healthy weight.  For females 20 years of age and  older:   A BMI below 18.5 is considered underweight.  A BMI of 18.5 to 24.9 is normal.  A BMI of 25 to 29.9 is considered overweight.  A BMI of 30 and above is considered obese.  Watch levels of cholesterol and blood lipids  You should start having your blood tested for lipids and cholesterol at 75 years of age, then have this test every 5 years.  You may need to have your cholesterol levels checked more often if:  Your lipid or cholesterol levels are high.  You are older than 75 years of age.  You are at high risk for heart disease.  CANCER SCREENING   Lung Cancer  Lung cancer screening is recommended for adults 55-80 years old who are at high risk for lung cancer because of a history of smoking.  A yearly low-dose CT scan of the lungs is recommended for people who:  Currently smoke.  Have quit within the past 15 years.  Have at least a 30-pack-year history of smoking. A pack year is smoking an average of one pack of cigarettes a day for 1 year.  Yearly screening should continue until it has been 15 years since you quit.  Yearly screening should stop if you develop a health problem that would prevent you from having lung cancer treatment.  Breast Cancer  Practice breast self-awareness. This means understanding how your breasts normally appear and feel.  It also means doing regular breast self-exams. Let your health   provider know about any changes, no matter how small.  If you are in your 20s or 30s, you should have a clinical breast exam (CBE) by a health care provider every 1-3 years as part of a regular health exam.  If you are 67 or older, have a CBE every year. Also consider having a breast X-ray (mammogram) every year.  If you have a family history of breast cancer, talk to your health care provider about genetic screening.  If you are at high risk for breast cancer, talk to your health care provider about having an MRI and a mammogram every  year.  Breast cancer gene (BRCA) assessment is recommended for women who have family members with BRCA-related cancers. BRCA-related cancers include:  Breast.  Ovarian.  Tubal.  Peritoneal cancers.  Results of the assessment will determine the need for genetic counseling and BRCA1 and BRCA2 testing. Cervical Cancer Routine pelvic examinations to screen for cervical cancer are no longer recommended for nonpregnant women who are considered low risk for cancer of the pelvic organs (ovaries, uterus, and vagina) and who do not have symptoms. A pelvic examination may be necessary if you have symptoms including those associated with pelvic infections. Ask your health care provider if a screening pelvic exam is right for you.   The Pap test is the screening test for cervical cancer for women who are considered at risk.  If you had a hysterectomy for a problem that was not cancer or a condition that could lead to cancer, then you no longer need Pap tests.  If you are older than 65 years, and you have had normal Pap tests for the past 10 years, you no longer need to have Pap tests.  If you have had past treatment for cervical cancer or a condition that could lead to cancer, you need Pap tests and screening for cancer for at least 20 years after your treatment.  If you no longer get a Pap test, assess your risk factors if they change (such as having a new sexual partner). This can affect whether you should start being screened again.  Some women have medical problems that increase their chance of getting cervical cancer. If this is the case for you, your health care provider may recommend more frequent screening and Pap tests.  The human papillomavirus (HPV) test is another test that may be used for cervical cancer screening. The HPV test looks for the virus that can cause cell changes in the cervix. The cells collected during the Pap test can be tested for HPV.  The HPV test can be used to screen  women 4 years of age and older. Getting tested for HPV can extend the interval between normal Pap tests from three to five years.  An HPV test also should be used to screen women of any age who have unclear Pap test results.  After 75 years of age, women should have HPV testing as often as Pap tests.  Colorectal Cancer  This type of cancer can be detected and often prevented.  Routine colorectal cancer screening usually begins at 75 years of age and continues through 75 years of age.  Your health care provider may recommend screening at an earlier age if you have risk factors for colon cancer.  Your health care provider may also recommend using home test kits to check for hidden blood in the stool.  A small camera at the end of a tube can be used to examine your colon  directly (sigmoidoscopy or colonoscopy). This is done to check for the earliest forms of colorectal cancer.  Routine screening usually begins at age 2.  Direct examination of the colon should be repeated every 5-10 years through 75 years of age. However, you may need to be screened more often if early forms of precancerous polyps or small growths are found. Skin Cancer  Check your skin from head to toe regularly.  Tell your health care provider about any new moles or changes in moles, especially if there is a change in a mole's shape or color.  Also tell your health care provider if you have a mole that is larger than the size of a pencil eraser.  Always use sunscreen. Apply sunscreen liberally and repeatedly throughout the day.  Protect yourself by wearing long sleeves, pants, a wide-brimmed hat, and sunglasses whenever you are outside. HEART DISEASE, DIABETES, AND HIGH BLOOD PRESSURE   Have your blood pressure checked at least every 1-2 years. High blood pressure causes heart disease and increases the risk of stroke.  If you are between 17 years and 65 years old, ask your health care provider if you should take  aspirin to prevent strokes.  Have regular diabetes screenings. This involves taking a blood sample to check your fasting blood sugar level.  If you are at a normal weight and have a low risk for diabetes, have this test once every three years after 75 years of age.  If you are overweight and have a high risk for diabetes, consider being tested at a younger age or more often. PREVENTING INFECTION  Hepatitis B  If you have a higher risk for hepatitis B, you should be screened for this virus. You are considered at high risk for hepatitis B if:  You were born in a country where hepatitis B is common. Ask your health care provider which countries are considered high risk.  Your parents were born in a high-risk country, and you have not been immunized against hepatitis B (hepatitis B vaccine).  You have HIV or AIDS.  You use needles to inject street drugs.  You live with someone who has hepatitis B.  You have had sex with someone who has hepatitis B.  You get hemodialysis treatment.  You take certain medicines for conditions, including cancer, organ transplantation, and autoimmune conditions. Hepatitis C  Blood testing is recommended for:  Everyone born from [birth year] through 1966.  Anyone with known risk factors for hepatitis C. Sexually transmitted infections (STIs)  You should be screened for sexually transmitted infections (STIs) including gonorrhea and chlamydia if:  You are sexually active and are younger than 75 years of age.  You are older than 75 years of age and your health care provider tells you that you are at risk for this type of infection.  Your sexual activity has changed since you were last screened and you are at an increased risk for chlamydia or gonorrhea. Ask your health care provider if you are at risk.  If you do not have HIV, but are at risk, it may be recommended that you take a prescription medicine daily to prevent HIV infection. This is called  pre-exposure prophylaxis (PrEP). You are considered at risk if:  You are sexually active and do not regularly use condoms or know the HIV status of your partner(s).  You take drugs by injection.  You are sexually active with a partner who has HIV. Talk with your health care provider about whether you are  at high risk of being infected with HIV. If you choose to begin PrEP, you should first be tested for HIV. You should then be tested every 3 months for as long as you are taking PrEP.  PREGNANCY   If you are premenopausal and you may become pregnant, ask your health care provider about preconception counseling.  If you may become pregnant, take 400 to 800 micrograms (mcg) of folic acid every day.  If you want to prevent pregnancy, talk to your health care provider about birth control (contraception). OSTEOPOROSIS AND MENOPAUSE   Osteoporosis is a disease in which the bones lose minerals and strength with aging. This can result in serious bone fractures. Your risk for osteoporosis can be identified using a bone density scan.  If you are 65 years of age or older, or if you are at risk for osteoporosis and fractures, ask your health care provider if you should be screened.  Ask your health care provider whether you should take a calcium or vitamin D supplement to lower your risk for osteoporosis.  Menopause may have certain physical symptoms and risks.  Hormone replacement therapy may reduce some of these symptoms and risks. Talk to your health care provider about whether hormone replacement therapy is right for you.  HOME CARE INSTRUCTIONS   Schedule regular health, dental, and eye exams.  Stay current with your immunizations.   Do not use any tobacco products including cigarettes, chewing tobacco, or electronic cigarettes.  If you are pregnant, do not drink alcohol.  If you are breastfeeding, limit how much and how often you drink alcohol.  Limit alcohol intake to no more than 1  drink per day for nonpregnant women. One drink equals 12 ounces of beer, 5 ounces of wine, or 1 ounces of hard liquor.  Do not use street drugs.  Do not share needles.  Ask your health care provider for help if you need support or information about quitting drugs.  Tell your health care provider if you often feel depressed.  Tell your health care provider if you have ever been abused or do not feel safe at home. Document Released: 05/19/2011 Document Revised: 03/20/2014 Document Reviewed: 10/05/2013 [employer] Patient Information 2015 [employer]. This information is not intended to replace advice given to you by your health care provider. Make sure you discuss any questions you have with your health care provider.  

## 2015-04-09 NOTE — Progress Notes (Signed)
Pre visit review using our clinic review tool, if applicable. No additional management support is needed unless otherwise documented below in the visit note. 

## 2015-04-19 ENCOUNTER — Telehealth: Payer: Self-pay

## 2015-04-19 NOTE — Telephone Encounter (Signed)
Left mess on cell concerning overdue mammogram

## 2015-06-14 DIAGNOSIS — S99911A Unspecified injury of right ankle, initial encounter: Secondary | ICD-10-CM | POA: Diagnosis not present

## 2015-06-14 DIAGNOSIS — S8992XA Unspecified injury of left lower leg, initial encounter: Secondary | ICD-10-CM | POA: Diagnosis not present

## 2015-06-14 DIAGNOSIS — S99922A Unspecified injury of left foot, initial encounter: Secondary | ICD-10-CM | POA: Diagnosis not present

## 2015-06-14 DIAGNOSIS — S4992XD Unspecified injury of left shoulder and upper arm, subsequent encounter: Secondary | ICD-10-CM | POA: Diagnosis not present

## 2015-06-14 DIAGNOSIS — S6992XA Unspecified injury of left wrist, hand and finger(s), initial encounter: Secondary | ICD-10-CM | POA: Diagnosis not present

## 2015-06-14 DIAGNOSIS — S99921A Unspecified injury of right foot, initial encounter: Secondary | ICD-10-CM | POA: Diagnosis not present

## 2015-06-14 DIAGNOSIS — S59902A Unspecified injury of left elbow, initial encounter: Secondary | ICD-10-CM | POA: Diagnosis not present

## 2015-06-14 DIAGNOSIS — S93401A Sprain of unspecified ligament of right ankle, initial encounter: Secondary | ICD-10-CM | POA: Diagnosis not present

## 2015-06-18 DIAGNOSIS — M25522 Pain in left elbow: Secondary | ICD-10-CM | POA: Diagnosis not present

## 2015-06-18 DIAGNOSIS — S93491A Sprain of other ligament of right ankle, initial encounter: Secondary | ICD-10-CM | POA: Diagnosis not present

## 2015-06-18 DIAGNOSIS — M25512 Pain in left shoulder: Secondary | ICD-10-CM | POA: Diagnosis not present

## 2015-07-04 DIAGNOSIS — S93491D Sprain of other ligament of right ankle, subsequent encounter: Secondary | ICD-10-CM | POA: Diagnosis not present

## 2015-08-09 ENCOUNTER — Ambulatory Visit (INDEPENDENT_AMBULATORY_CARE_PROVIDER_SITE_OTHER): Payer: Medicare PPO | Admitting: *Deleted

## 2015-08-09 DIAGNOSIS — Z23 Encounter for immunization: Secondary | ICD-10-CM

## 2016-04-28 ENCOUNTER — Encounter: Payer: Self-pay | Admitting: Internal Medicine

## 2016-05-28 ENCOUNTER — Other Ambulatory Visit: Payer: Medicare PPO

## 2016-06-02 ENCOUNTER — Encounter: Payer: Medicare PPO | Admitting: Internal Medicine

## 2016-06-05 ENCOUNTER — Other Ambulatory Visit: Payer: Medicare PPO

## 2016-06-06 ENCOUNTER — Other Ambulatory Visit (INDEPENDENT_AMBULATORY_CARE_PROVIDER_SITE_OTHER): Payer: Medicare Other

## 2016-06-06 DIAGNOSIS — Z0000 Encounter for general adult medical examination without abnormal findings: Secondary | ICD-10-CM

## 2016-06-06 LAB — HEPATIC FUNCTION PANEL
ALT: 16 U/L (ref 0–35)
AST: 17 U/L (ref 0–37)
Albumin: 4.2 g/dL (ref 3.5–5.2)
Alkaline Phosphatase: 98 U/L (ref 39–117)
BILIRUBIN DIRECT: 0.1 mg/dL (ref 0.0–0.3)
BILIRUBIN TOTAL: 0.6 mg/dL (ref 0.2–1.2)
TOTAL PROTEIN: 7.2 g/dL (ref 6.0–8.3)

## 2016-06-06 LAB — BASIC METABOLIC PANEL
BUN: 16 mg/dL (ref 6–23)
CHLORIDE: 107 meq/L (ref 96–112)
CO2: 25 meq/L (ref 19–32)
Calcium: 10.3 mg/dL (ref 8.4–10.5)
Creatinine, Ser: 0.78 mg/dL (ref 0.40–1.20)
GFR: 76.33 mL/min (ref >60.00)
GLUCOSE: 90 mg/dL (ref 70–99)
POTASSIUM: 4.2 meq/L (ref 3.5–5.1)
Sodium: 140 mEq/L (ref 135–145)

## 2016-06-06 LAB — CBC WITH DIFFERENTIAL/PLATELET
BASOS PCT: 0.5 % (ref 0.0–3.0)
Basophils Absolute: 0 10*3/uL (ref 0.0–0.1)
EOS PCT: 2.7 % (ref 0.0–5.0)
Eosinophils Absolute: 0.2 10*3/uL (ref 0.0–0.7)
HEMATOCRIT: 43.7 % (ref 36.0–46.0)
HEMOGLOBIN: 14.7 g/dL (ref 12.0–15.0)
LYMPHS PCT: 27.4 % (ref 12.0–46.0)
Lymphs Abs: 1.7 10*3/uL (ref 0.7–4.0)
MCHC: 33.7 g/dL (ref 30.0–36.0)
MCV: 90.9 fl (ref 78.0–100.0)
MONOS PCT: 8.3 % (ref 3.0–12.0)
Monocytes Absolute: 0.5 10*3/uL (ref 0.1–1.0)
Neutro Abs: 3.9 10*3/uL (ref 1.4–7.7)
Neutrophils Relative %: 61.1 % (ref 43.0–77.0)
Platelets: 248 10*3/uL (ref 150.0–400.0)
RBC: 4.81 Mil/uL (ref 3.87–5.11)
RDW: 13.8 % (ref 11.5–15.5)
WBC: 6.3 10*3/uL (ref 4.0–10.5)

## 2016-06-06 LAB — POC URINALSYSI DIPSTICK (AUTOMATED)
Bilirubin, UA: NEGATIVE
GLUCOSE UA: NEGATIVE
Ketones, UA: NEGATIVE
NITRITE UA: NEGATIVE
PROTEIN UA: NEGATIVE
SPEC GRAV UA: 1.015
UROBILINOGEN UA: 1
pH, UA: 7

## 2016-06-06 LAB — LIPID PANEL
CHOL/HDL RATIO: 4
CHOLESTEROL: 223 mg/dL — AB (ref 0–200)
HDL: 49.5 mg/dL (ref >39.00)
LDL CALC: 136 mg/dL — AB (ref 0–99)
NonHDL: 173.22
Triglycerides: 184 mg/dL — ABNORMAL HIGH (ref 0.0–149.0)
VLDL: 36.8 mg/dL (ref 0.0–40.0)

## 2016-06-06 LAB — TSH: TSH: 1.31 u[IU]/mL (ref 0.35–4.50)

## 2016-06-10 ENCOUNTER — Ambulatory Visit (INDEPENDENT_AMBULATORY_CARE_PROVIDER_SITE_OTHER): Payer: Medicare Other | Admitting: Internal Medicine

## 2016-06-10 ENCOUNTER — Encounter: Payer: Self-pay | Admitting: Internal Medicine

## 2016-06-10 VITALS — BP 142/100 | HR 89 | Temp 98.2°F | Ht 62.0 in | Wt 143.2 lb

## 2016-06-10 DIAGNOSIS — E785 Hyperlipidemia, unspecified: Secondary | ICD-10-CM

## 2016-06-10 DIAGNOSIS — M150 Primary generalized (osteo)arthritis: Secondary | ICD-10-CM | POA: Diagnosis not present

## 2016-06-10 DIAGNOSIS — Z0000 Encounter for general adult medical examination without abnormal findings: Secondary | ICD-10-CM | POA: Diagnosis not present

## 2016-06-10 DIAGNOSIS — M159 Polyosteoarthritis, unspecified: Secondary | ICD-10-CM

## 2016-06-10 DIAGNOSIS — I10 Essential (primary) hypertension: Secondary | ICD-10-CM

## 2016-06-10 NOTE — Patient Instructions (Addendum)
Limit your sodium (Salt) intake  Please check your blood pressure on a regular basis.  If it is consistently greater than 140/90, please make an office appointment.    It is important that you exercise regularly, at least 20 minutes 3 to 4 times per week.  If you develop chest pain or shortness of breath seek  medical attention.  Take a calcium supplement, plus [phone] units of vitamin D  Schedule your mammogram.  Return in one year for follow-up

## 2016-06-10 NOTE — Progress Notes (Signed)
Subjective:    Patient ID: Kayla [name], female    DOB: [birth year], 75 y.o.   MRN: [id number]  HPI  75 year old patient who is seen today for a preventive health examination. She has a history of osteoarthritis and is status post hip replacement therapy.  She has mild dyslipidemia and a prior history of essential hypertension.  She has been on diuretic therapy in the past for this was discontinued due to some concerns of hair loss.  She does monitor home blood pressure readings with normal results She has seen orthopedics lately due to left lateral leg pain  She had follow-up colonoscopy 2017.  Has had a recent eye examination  Past Medical History:  Diagnosis Date  . CHEST WALL PAIN, ANTERIOR 07/26/2008  . HYPERLIPIDEMIA 06/01/2008  . HYPERTENSION 07/28/2007  . MENOPAUSAL SYNDROME 06/01/2008     Social History   Social History  . Marital status: Married    Spouse name: N/A  . Number of children: N/A  . Years of education: N/A   Occupational History  . Not on file.   Social History Main Topics  . Smoking status: Never Smoker  . Smokeless tobacco: Never Used  . Alcohol use Yes     Comment: rare  . Drug use: No  . Sexual activity: Not on file   Other Topics Concern  . Not on file   Social History Narrative  . No narrative on file    Past Surgical History:  Procedure Laterality Date  . COLONOSCOPY    . NO PAST SURGERIES    . TOTAL HIP ARTHROPLASTY Right 07/04/2014   Procedure: RIGHT TOTAL HIP ARTHROPLASTY ANTERIOR APPROACH;  Surgeon: Sheral Apley, MD;  Location: [facility] OR;  Service: Orthopedics;  Laterality: Right;    Family History  Problem Relation Age of Onset  . Cancer Mother     lung ca  . Heart disease Father     No Known Allergies  Current Outpatient Prescriptions on File Prior to Visit  Medication Sig Dispense Refill  . aspirin 81 MG tablet Take 81 mg by mouth daily.    . Multiple Vitamin (MULTIVITAMIN WITH MINERALS) TABS tablet Take 1 tablet by  mouth daily.     No current facility-administered medications on file prior to visit.     BP (!) 142/100   Pulse 89   Temp 98.2 F (36.8 C) (Oral)   Ht 5\' 2"  (1.575 m)   Wt 143 lb 4 oz (65 kg)   SpO2 95%   BMI 26.20 kg/m     Here for Medicare AWV:   1. Risk factors based on Past M, S, F history:cardiovascular risk factors include a history of hypertension  2. Physical Activities: remains quite active with walking daily and also  golf  3. Depression/mood: no history of depression or mood disorder  4. Hearing: no deficits  5. ADL's: independent in all aspects of daily living  6. Fall Risk: low  7. Home Safety: no problems identified  8. Height, weight, &visual acuity:height and weight stable. No prior bone density study. No difficulty with visual acuity  9. Counseling: heart healthy diet. Discussed  10. Labs ordered based on risk factors: laboratory studies will be reviewed, including lipid profile  11. Referral Coordination- may require orthopedic referral if symptoms refractory to short-term anti-inflammatory medication  12. Care Plan- bone density study ordered  13. Cognitive Assessment- alert and oriented, with normal affect and memory dysfunction, able to perform all executive functions. No functional  limitations  14.  Preventive services will include colonoscopies on a 2 year interval.  She will have annual health examinations as well as annual eye examinations.  Mammograms.  Encouraged every 1 or 2 years 15.  Provider list up-to-date includes primary care medicine, orthopedics, ophthalmology.   Review of Systems  Constitutional: Negative for appetite change, fatigue, fever and unexpected weight change.  HENT: Negative for congestion, dental problem, ear pain, hearing loss, mouth sores, nosebleeds, sinus pressure, sore throat, tinnitus, trouble swallowing and voice change.   Eyes: Negative for photophobia, pain, redness and visual disturbance.  Respiratory: Negative for  cough, chest tightness and shortness of breath.   Cardiovascular: Negative for chest pain, palpitations and leg swelling.  Gastrointestinal: Negative for abdominal distention, abdominal pain, blood in stool, constipation, diarrhea, nausea, rectal pain and vomiting.  Genitourinary: Negative for difficulty urinating, dysuria, flank pain, frequency, genital sores, hematuria, menstrual problem, pelvic pain, urgency, vaginal bleeding, vaginal discharge and vaginal pain.  Musculoskeletal: Negative for arthralgias, back pain and neck stiffness.       Mild left leg pain  Skin: Negative for rash.  Neurological: Negative for dizziness, syncope, speech difficulty, weakness, light-headedness, numbness and headaches.  Hematological: Negative for adenopathy. Does not bruise/bleed easily.  Psychiatric/Behavioral: Negative for agitation, behavioral problems, dysphoric mood, self-injury and suicidal ideas. The patient is not nervous/anxious.        Objective:   Physical Exam  Constitutional: She is oriented to person, place, and time. She appears well-developed and well-nourished.  Blood pressure 140/90  HENT:  Head: Normocephalic and atraumatic.  Right Ear: External ear normal.  Left Ear: External ear normal.  Mouth/Throat: Oropharynx is clear and moist.  Eyes: Conjunctivae and EOM are normal.  Neck: Normal range of motion. Neck supple. No JVD present. No thyromegaly present.  Cardiovascular: Normal rate, regular rhythm, normal heart sounds and intact distal pulses.   No murmur heard. Pulmonary/Chest: Effort normal and breath sounds normal. She has no wheezes. She has no rales.  Abdominal: Soft. Bowel sounds are normal. She exhibits no distension and no mass. There is no tenderness. There is no rebound and no guarding.  Musculoskeletal: Normal range of motion. She exhibits no edema or tenderness.  Neurological: She is alert and oriented to person, place, and time. She has normal reflexes. No cranial  nerve deficit. She exhibits normal muscle tone. Coordination normal.  Skin: Skin is warm and dry. No rash noted.  Psychiatric: She has a normal mood and affect. Her behavior is normal.          Assessment & Plan:  Preventive health examination History of hypertension.  Blood pressure borderline high today.  We'll continue close home blood pressure monitoring.  Continue salt restricted diet and active lifestyle Menopausal syndrome Osteoarthritis  Rogelia Boga, MD

## 2016-07-22 ENCOUNTER — Other Ambulatory Visit: Payer: Medicare PPO

## 2016-07-22 ENCOUNTER — Ambulatory Visit (INDEPENDENT_AMBULATORY_CARE_PROVIDER_SITE_OTHER): Payer: Medicare Other | Admitting: *Deleted

## 2016-07-22 DIAGNOSIS — Z23 Encounter for immunization: Secondary | ICD-10-CM | POA: Diagnosis not present

## 2016-07-29 ENCOUNTER — Encounter: Payer: Medicare PPO | Admitting: Internal Medicine

## 2016-10-27 ENCOUNTER — Encounter: Payer: Self-pay | Admitting: Internal Medicine

## 2016-10-27 NOTE — Telephone Encounter (Signed)
Please see message and advise. Pt is up to date on Flu and Tetanus.

## 2017-03-24 ENCOUNTER — Other Ambulatory Visit: Payer: Medicare Other

## 2017-03-31 ENCOUNTER — Encounter: Payer: Medicare Other | Admitting: Internal Medicine

## 2017-04-10 ENCOUNTER — Other Ambulatory Visit: Payer: Medicare Other

## 2017-04-15 ENCOUNTER — Encounter: Payer: Self-pay | Admitting: Internal Medicine

## 2017-04-15 ENCOUNTER — Ambulatory Visit (INDEPENDENT_AMBULATORY_CARE_PROVIDER_SITE_OTHER): Payer: Medicare Other | Admitting: Internal Medicine

## 2017-04-15 VITALS — BP 142/70 | HR 78 | Temp 97.7°F | Ht 63.0 in | Wt 144.4 lb

## 2017-04-15 DIAGNOSIS — Z0000 Encounter for general adult medical examination without abnormal findings: Secondary | ICD-10-CM

## 2017-04-15 DIAGNOSIS — M150 Primary generalized (osteo)arthritis: Secondary | ICD-10-CM

## 2017-04-15 DIAGNOSIS — I10 Essential (primary) hypertension: Secondary | ICD-10-CM | POA: Diagnosis not present

## 2017-04-15 DIAGNOSIS — E2839 Other primary ovarian failure: Secondary | ICD-10-CM

## 2017-04-15 DIAGNOSIS — E785 Hyperlipidemia, unspecified: Secondary | ICD-10-CM

## 2017-04-15 DIAGNOSIS — M159 Polyosteoarthritis, unspecified: Secondary | ICD-10-CM

## 2017-04-15 NOTE — Progress Notes (Signed)
Subjective:    Patient ID: Kayla [name], female    DOB: 05-30-1941, 76 y.o.   MRN: [id number]  HPI  76 year old patient who is seen today for a preventive health examination: Medicare wellness visit She does remarkably well.  She does have history of mild essential hypertension, previously treated with diuretic therapy, but more recently has been controlled off medication No other concerns or complaints  Social history.  Married.  Enjoys a number of grandchildren spends [place] in [place]  Follow-up colonoscopy 2017  Past Medical History:  Diagnosis Date  . CHEST WALL PAIN, ANTERIOR 07/26/2008  . HYPERLIPIDEMIA 06/01/2008  . HYPERTENSION 07/28/2007  . MENOPAUSAL SYNDROME 06/01/2008     Social History   Social History  . Marital status: Married    Spouse name: N/A  . Number of children: N/A  . Years of education: N/A   Occupational History  . Not on file.   Social History Main Topics  . Smoking status: Never Smoker  . Smokeless tobacco: Never Used  . Alcohol use Yes     Comment: rare  . Drug use: No  . Sexual activity: Not on file   Other Topics Concern  . Not on file   Social History Narrative  . No narrative on file    Past Surgical History:  Procedure Laterality Date  . COLONOSCOPY    . NO PAST SURGERIES    . 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 Cancer Mother        lung ca  . Heart disease Father     No Known Allergies  Current Outpatient Prescriptions on File Prior to Visit  Medication Sig Dispense Refill  . aspirin 81 MG tablet Take 81 mg by mouth daily.    . Multiple Vitamin (MULTIVITAMIN WITH MINERALS) TABS tablet Take 1 tablet by mouth daily.     No current facility-administered medications on file prior to visit.      BP (!) 142/70 (BP Location: Left Arm, Patient Position: Sitting, Cuff Size: Normal)   Pulse 78   Temp 97.7 F (36.5 C) (Oral)   Ht 5\' 3"  (1.6 m)   Wt 144 lb 6.4 oz (65.5 kg)   SpO2 96%   BMI 25.58 kg/m   Medicare wellness visit    1. Risk factors based on Past M, S, F history:cardiovascular risk factors include a history of hypertension Presley controlled off medication 2. Physical Activities: remains quite active with walking daily and also golf cheaper to space in water aerobics and also biking 3. Depression/mood: no history of depression or mood disorder  4. Hearing: no deficits  5. ADL's: independent in all aspects of daily living  6. Fall Risk: low  7. Home Safety: no problems identified  8. Height, weight, &visual acuity:height and weight stable. No prior bone density study. No difficulty with visual acuity patient is scheduled for follow-up eye examination tomorrow 9. Counseling: heart healthy diet. Discussed  10. Labs ordered based on risk factors: laboratory studies will be reviewed, including lipid profile  11. Referral Coordination- scheduled for dermatology follow-up 12. Care Plan- bone density study ordered  13. Cognitive Assessment- alert and oriented, with normal affect and memory dysfunction, able to perform all executive functions. No functional limitations  14. Preventive services:  Patient has had her final colonoscopy. She will have annual health examinations as well as annual eye examinations. Mammograms. Encouraged every 1 or 2 years 15. Provider list up-to-date includes primary care medicine, orthopedics, ophthalmology.as well as dermatology   Review of Systems  Constitutional: Negative.   HENT: Negative for congestion, dental problem, hearing loss, rhinorrhea, sinus pressure, sore throat and tinnitus.   Eyes: Negative for pain, discharge and visual disturbance.  Respiratory: Negative for cough and shortness of breath.   Cardiovascular: Negative  for chest pain, palpitations and leg swelling.  Gastrointestinal: Negative for abdominal distention, abdominal pain, blood in stool, constipation, diarrhea, nausea and vomiting.  Genitourinary: Negative for difficulty urinating, dysuria, flank pain, frequency, hematuria, pelvic pain, urgency, vaginal bleeding, vaginal discharge and vaginal pain.  Musculoskeletal: Positive for arthralgias and back pain. Negative for gait problem and joint swelling.  Skin: Positive for rash.  Neurological: Negative for dizziness, syncope, speech difficulty, weakness, numbness and headaches.  Hematological: Negative for adenopathy.  Psychiatric/Behavioral: Negative for agitation, behavioral problems and dysphoric mood. The patient is not nervous/anxious.        Objective:   Physical Exam  Constitutional: She is oriented to person, place, and time. She appears well-developed and well-nourished.  Blood pressure 130/80  HENT:  Head: Normocephalic and atraumatic.  Right Ear: External ear normal.  Left Ear: External ear normal.  Mouth/Throat: Oropharynx is clear and moist.  Eyes: Conjunctivae and EOM are normal.  Neck: Normal range of motion. Neck supple. No JVD present. No thyromegaly present.  Cardiovascular: Normal rate, regular rhythm, normal heart sounds and intact distal pulses.   No murmur heard. Pedal pulses full  Pulmonary/Chest: Effort normal and breath sounds normal. She has no wheezes. She has no rales.  Abdominal: Soft. Bowel sounds are normal. She exhibits no distension and no mass. There is no tenderness. There is no rebound and no guarding.  Musculoskeletal: Normal range of motion. She exhibits no edema or tenderness.  Neurological: She is alert and oriented to person, place, and time. She has normal reflexes. No cranial nerve deficit. She exhibits normal muscle tone. Coordination normal.  Skin: Skin is warm and dry. No rash noted.  Psychiatric: She has a normal mood and affect. Her behavior is  normal.          Assessment & Plan:   Preventive health examination Medicare wellness visit History of essential hypertension.  Blood pressure remains controlled off medication.  Continue low-salt diet, active lifestyle and home blood pressure monitoring Osteoarthritis  Mammogram and bone density study.  Encouraged  Follow-up one year  Kayla [name]

## 2017-04-15 NOTE — Patient Instructions (Addendum)
WE NOW OFFER   [facility] FAST TRACK!!!  SAME DAY Appointments for ACUTE CARE  Such as: Sprains, Injuries, cuts, abrasions, rashes, muscle pain, joint pain, back pain Colds, flu, sore throats, headache, allergies, cough, fever  Ear pain, sinus and eye infections Abdominal pain, nausea, vomiting, diarrhea, upset stomach Animal/insect bites  3 Easy Ways to Schedule: Walk-In Scheduling Call in scheduling Mychart Sign-up: [email or web address]  Limit your sodium (Salt) intake  Please check your blood pressure on a regular basis.  If it is consistently greater than 150/90, please make an office appointment.    It is important that you exercise regularly, at least 20 minutes 3 to 4 times per week.  If you develop chest pain or shortness of breath seek  medical attention.         Health Maintenance for Postmenopausal Women Menopause is a normal process in which your reproductive ability comes to an end. This process happens gradually over a span of months to years, usually between the ages of 48 and 55. Menopause is complete when you have missed 12 consecutive menstrual periods. It is important to talk with your health care provider about some of the most common conditions that affect postmenopausal women, such as heart disease, cancer, and bone loss (osteoporosis). Adopting a healthy lifestyle and getting preventive care can help to promote your health and wellness. Those actions can also lower your chances of developing some of these common conditions. What should I know about menopause? During menopause, you may experience a number of symptoms, such as:  Moderate-to-severe hot flashes.  Night sweats.  Decrease in sex drive.  Mood swings.  Headaches.  Tiredness.  Irritability.  Memory problems.  Insomnia. Choosing to treat or not to treat menopausal changes is an individual decision that you make with your health care provider. What should I know  about hormone replacement therapy and supplements? Hormone therapy products are effective for treating symptoms that are associated with menopause, such as hot flashes and night sweats. Hormone replacement carries certain risks, especially as you become older. If you are thinking about using estrogen or estrogen with progestin treatments, discuss the benefits and risks with your health care provider. What should I know about heart disease and stroke? Heart disease, heart attack, and stroke become more likely as you age. This may be due, in part, to the hormonal changes that your body experiences during menopause. These can affect how your body processes dietary fats, triglycerides, and cholesterol. Heart attack and stroke are both medical emergencies. There are many things that you can do to help prevent heart disease and stroke:  Have your blood pressure checked at least every 1-2 years. High blood pressure causes heart disease and increases the risk of stroke.  If you are 55-79 years old, ask your health care provider if you should take aspirin to prevent a heart attack or a stroke.  Do not use any tobacco products, including cigarettes, chewing tobacco, or electronic cigarettes. If you need help quitting, ask your health care provider.  It is important to eat a healthy diet and maintain a healthy weight.  Be sure to include plenty of vegetables, fruits, low-fat dairy products, and lean protein.  Avoid eating foods that are high in solid fats, added sugars, or salt (sodium).  Get regular exercise. This is one of the most important things that you can do for your health.  Try to exercise for at least 150 minutes each week. The type of exercise that you do   should increase your heart rate and make you sweat. This is known as moderate-intensity exercise.  Try to do strengthening exercises at least twice each week. Do these in addition to the moderate-intensity exercise.  Know your numbers.Ask  your health care provider to check your cholesterol and your blood glucose. Continue to have your blood tested as directed by your health care provider. What should I know about cancer screening? There are several types of cancer. Take the following steps to reduce your risk and to catch any cancer development as early as possible. Breast Cancer  Practice breast self-awareness.  This means understanding how your breasts normally appear and feel.  It also means doing regular breast self-exams. Let your health care provider know about any changes, no matter how small.  If you are 73 or older, have a clinician do a breast exam (clinical breast exam or CBE) every year. Depending on your age, family history, and medical history, it may be recommended that you also have a yearly breast X-ray (mammogram).  If you have a family history of breast cancer, talk with your health care provider about genetic screening.  If you are at high risk for breast cancer, talk with your health care provider about having an MRI and a mammogram every year.  Breast cancer (BRCA) gene test is recommended for women who have family members with BRCA-related cancers. Results of the assessment will determine the need for genetic counseling and BRCA1 and for BRCA2 testing. BRCA-related cancers include these types:  Breast. This occurs in males or females.  Ovarian.  Tubal. This may also be called fallopian tube cancer.  Cancer of the abdominal or pelvic lining (peritoneal cancer).  Prostate.  Pancreatic. Cervical, Uterine, and Ovarian Cancer  Your health care provider may recommend that you be screened regularly for cancer of the pelvic organs. These include your ovaries, uterus, and vagina. This screening involves a pelvic exam, which includes checking for microscopic changes to the surface of your cervix (Pap test).  For women ages 21-65, health care providers may recommend a pelvic exam and a Pap test every three  years. For women ages 10-65, they may recommend the Pap test and pelvic exam, combined with testing for human papilloma virus (HPV), every five years. Some types of HPV increase your risk of cervical cancer. Testing for HPV may also be done on women of any age who have unclear Pap test results.  Other health care providers may not recommend any screening for nonpregnant women who are considered low risk for pelvic cancer and have no symptoms. Ask your health care provider if a screening pelvic exam is right for you.  If you have had past treatment for cervical cancer or a condition that could lead to cancer, you need Pap tests and screening for cancer for at least 20 years after your treatment. If Pap tests have been discontinued for you, your risk factors (such as having a new sexual partner) need to be reassessed to determine if you should start having screenings again. Some women have medical problems that increase the chance of getting cervical cancer. In these cases, your health care provider may recommend that you have screening and Pap tests more often.  If you have a family history of uterine cancer or ovarian cancer, talk with your health care provider about genetic screening.  If you have vaginal bleeding after reaching menopause, tell your health care provider.  There are currently no reliable tests available to screen for ovarian cancer.  Lung Cancer  Lung cancer screening is recommended for adults 55-80 years old who are at high risk for lung cancer because of a history of smoking. A yearly low-dose CT scan of the lungs is recommended if you:  Currently smoke.  Have a history of at least 30 pack-years of smoking and you currently smoke or have quit within the past 15 years. A pack-year is smoking an average of one pack of cigarettes per day for one year. Yearly screening should:  Continue until it has been 15 years since you quit.  Stop if you develop a health problem that would  prevent you from having lung cancer treatment. Colorectal Cancer  This type of cancer can be detected and can often be prevented.  Routine colorectal cancer screening usually begins at age 50 and continues through age 75.  If you have risk factors for colon cancer, your health care provider may recommend that you be screened at an earlier age.  If you have a family history of colorectal cancer, talk with your health care provider about genetic screening.  Your health care provider may also recommend using home test kits to check for hidden blood in your stool.  A small camera at the end of a tube can be used to examine your colon directly (sigmoidoscopy or colonoscopy). This is done to check for the earliest forms of colorectal cancer.  Direct examination of the colon should be repeated every 5-10 years until age 75. However, if early forms of precancerous polyps or small growths are found or if you have a family history or genetic risk for colorectal cancer, you may need to be screened more often. Skin Cancer  Check your skin from head to toe regularly.  Monitor any moles. Be sure to tell your health care provider:  About any new moles or changes in moles, especially if there is a change in a mole's shape or color.  If you have a mole that is larger than the size of a pencil eraser.  If any of your family members has a history of skin cancer, especially at a young age, talk with your health care provider about genetic screening.  Always use sunscreen. Apply sunscreen liberally and repeatedly throughout the day.  Whenever you are outside, protect yourself by wearing long sleeves, pants, a wide-brimmed hat, and sunglasses. What should I know about osteoporosis? Osteoporosis is a condition in which bone destruction happens more quickly than new bone creation. After menopause, you may be at an increased risk for osteoporosis. To help prevent osteoporosis or the bone fractures that can  happen because of osteoporosis, the following is recommended:  If you are 19-50 years old, get at least 1,000 mg of calcium and at least 600 mg of vitamin D per day.  If you are older than age 50 but younger than age 70, get at least 1,200 mg of calcium and at least 600 mg of vitamin D per day.  If you are older than age 70, get at least 1,200 mg of calcium and at least 800 mg of vitamin D per day. Smoking and excessive alcohol intake increase the risk of osteoporosis. Eat foods that are rich in calcium and vitamin D, and do weight-bearing exercises several times each week as directed by your health care provider. What should I know about how menopause affects my mental health? Depression may occur at any age, but it is more common as you become older. Common symptoms of depression include:  Low   or sad mood.  Changes in sleep patterns.  Changes in appetite or eating patterns.  Feeling an overall lack of motivation or enjoyment of activities that you previously enjoyed.  Frequent crying spells. Talk with your health care provider if you think that you are experiencing depression. What should I know about immunizations? It is important that you get and maintain your immunizations. These include:  Tetanus, diphtheria, and pertussis (Tdap) booster vaccine.  Influenza every year before the flu season begins.  Pneumonia vaccine.  Shingles vaccine. Your health care provider may also recommend other immunizations. This information is not intended to replace advice given to you by your health care provider. Make sure you discuss any questions you have with your health care provider. Document Released: 12/26/2005 Document Revised: 05/23/2016 Document Reviewed: 08/07/2015 Elsevier Interactive Patient Education  2017 [employer].

## 2017-08-06 ENCOUNTER — Encounter: Payer: Self-pay | Admitting: Internal Medicine

## 2017-08-13 ENCOUNTER — Ambulatory Visit (INDEPENDENT_AMBULATORY_CARE_PROVIDER_SITE_OTHER): Payer: Medicare Other | Admitting: *Deleted

## 2017-08-13 DIAGNOSIS — Z23 Encounter for immunization: Secondary | ICD-10-CM | POA: Diagnosis not present

## 2018-04-19 ENCOUNTER — Encounter: Payer: Self-pay | Admitting: Internal Medicine

## 2018-04-19 ENCOUNTER — Ambulatory Visit (INDEPENDENT_AMBULATORY_CARE_PROVIDER_SITE_OTHER): Payer: Medicare Other | Admitting: Internal Medicine

## 2018-04-19 VITALS — BP 138/82 | HR 68 | Temp 97.7°F | Wt 141.8 lb

## 2018-04-19 DIAGNOSIS — E785 Hyperlipidemia, unspecified: Secondary | ICD-10-CM | POA: Diagnosis not present

## 2018-04-19 DIAGNOSIS — I10 Essential (primary) hypertension: Secondary | ICD-10-CM

## 2018-04-19 DIAGNOSIS — M159 Polyosteoarthritis, unspecified: Secondary | ICD-10-CM

## 2018-04-19 DIAGNOSIS — M150 Primary generalized (osteo)arthritis: Secondary | ICD-10-CM

## 2018-04-19 DIAGNOSIS — Z0000 Encounter for general adult medical examination without abnormal findings: Secondary | ICD-10-CM

## 2018-04-19 DIAGNOSIS — Z1211 Encounter for screening for malignant neoplasm of colon: Secondary | ICD-10-CM | POA: Diagnosis not present

## 2018-04-19 LAB — COMPREHENSIVE METABOLIC PANEL
ALBUMIN: 4.3 g/dL (ref 3.5–5.2)
ALK PHOS: 109 U/L (ref 39–117)
ALT: 15 U/L (ref 0–35)
AST: 15 U/L (ref 0–37)
BUN: 14 mg/dL (ref 6–23)
CO2: 27 mEq/L (ref 19–32)
CREATININE: 0.72 mg/dL (ref 0.40–1.20)
Calcium: 10.1 mg/dL (ref 8.4–10.5)
Chloride: 107 mEq/L (ref 96–112)
GFR: 83.31 mL/min (ref >60.00)
GLUCOSE: 97 mg/dL (ref 70–99)
Potassium: 4 mEq/L (ref 3.5–5.1)
SODIUM: 140 meq/L (ref 135–145)
Total Bilirubin: 0.6 mg/dL (ref 0.2–1.2)
Total Protein: 7.2 g/dL (ref 6.0–8.3)

## 2018-04-19 LAB — LIPID PANEL
CHOLESTEROL: 230 mg/dL — AB (ref 0–200)
HDL: 45 mg/dL (ref >39.00)
LDL Cholesterol: 147 mg/dL — ABNORMAL HIGH (ref 0–99)
NONHDL: 185.33
Total CHOL/HDL Ratio: 5
Triglycerides: 194 mg/dL — ABNORMAL HIGH (ref 0.0–149.0)
VLDL: 38.8 mg/dL (ref 0.0–40.0)

## 2018-04-19 LAB — CBC WITH DIFFERENTIAL/PLATELET
Basophils Absolute: 0 10*3/uL (ref 0.0–0.1)
Basophils Relative: 0.7 % (ref 0.0–3.0)
EOS ABS: 0.1 10*3/uL (ref 0.0–0.7)
Eosinophils Relative: 2.2 % (ref 0.0–5.0)
HCT: 44 % (ref 36.0–46.0)
HEMOGLOBIN: 14.8 g/dL (ref 12.0–15.0)
LYMPHS ABS: 1.5 10*3/uL (ref 0.7–4.0)
Lymphocytes Relative: 25.8 % (ref 12.0–46.0)
MCHC: 33.6 g/dL (ref 30.0–36.0)
MCV: 93.3 fl (ref 78.0–100.0)
MONO ABS: 0.6 10*3/uL (ref 0.1–1.0)
Monocytes Relative: 9.5 % (ref 3.0–12.0)
Neutro Abs: 3.7 10*3/uL (ref 1.4–7.7)
Neutrophils Relative %: 61.8 % (ref 43.0–77.0)
Platelets: 265 10*3/uL (ref 150.0–400.0)
RBC: 4.71 Mil/uL (ref 3.87–5.11)
RDW: 13.8 % (ref 11.5–15.5)
WBC: 6 10*3/uL (ref 4.0–10.5)

## 2018-04-19 LAB — TSH: TSH: 1.51 u[IU]/mL (ref 0.35–4.50)

## 2018-04-19 MED ORDER — HYDROCHLOROTHIAZIDE 12.5 MG PO TABS: 12.5000 mg | ORAL_TABLET | Freq: Every day | ORAL | 3 refills | Status: DC

## 2018-04-19 NOTE — Patient Instructions (Signed)
Limit your sodium (Salt) intake    It is important that you exercise regularly, at least 20 minutes 3 to 4 times per week.  If you develop chest pain or shortness of breath seek  medical attention.  Please check your blood pressure on a regular basis.  If it is consistently greater than 140/90, please make an office appointment.  Return in one year for follow-up  Okay to discontinue aspirin therapy

## 2018-04-19 NOTE — Progress Notes (Signed)
Subjective:    Patient ID: Kayla [name], female    DOB: 18-Apr-1941, 77 y.o.   MRN: [id number]  HPI  77 year old patient who is seen today for a preventive health examination as well as a subsequent Medicare wellness visit She is doing quite well. She does have a history of essential hypertension but more recently has been off medications.  She does monitor home blood pressure readings which are quite labile but often in a normal range She has a history of osteoarthritis and mild dyslipidemia.  Family history.  Mother died at 2775 of lung cancer.  She had a history of cerebrovascular disease.  Non-smoker Father died at 7084 One brother history of good health.  Presently having some issues with trigeminal neuralgia  Colonoscopy 2007   Past Medical History:  Diagnosis Date  . CHEST WALL PAIN, ANTERIOR 07/26/2008  . HYPERLIPIDEMIA 06/01/2008  . HYPERTENSION 07/28/2007  . MENOPAUSAL SYNDROME 06/01/2008     Social History   Socioeconomic History  . Marital status: Married    Spouse name: Not on file  . Number of children: Not on file  . Years of education: Not on file  . Highest education level: Not on file  Occupational History  . Not on file  Social Needs  . Financial resource strain: Not on file  . Food insecurity:    Worry: Not on file    Inability: Not on file  . Transportation needs:    Medical: Not on file    Non-medical: Not on file  Tobacco Use  . Smoking status: Never Smoker  . Smokeless tobacco: Never Used  Substance and Sexual Activity  . Alcohol use: Yes    Comment: rare  . Drug use: No  . Sexual activity: Not on file  Lifestyle  . Physical activity:    Days per week: Not on file    Minutes per session: Not on file  . Stress: Not on file  Relationships  . Social connections:    Talks on phone: Not on file    Gets together: Not on file    Attends religious service: Not on file    Active member of club or organization: Not on file    Attends meetings of  clubs or organizations: Not on file    Relationship status: Not on file  . Intimate partner violence:    Fear of current or ex partner: Not on file    Emotionally abused: Not on file    Physically abused: Not on file    Forced sexual activity: Not on file  Other Topics Concern  . Not on file  Social History Narrative  . Not on file    Past Surgical History:  Procedure Laterality Date  . COLONOSCOPY    . NO PAST SURGERIES    . 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 Cancer Mother        lung ca  . Heart disease Father     No Known Allergies  Current Outpatient Medications on File Prior to Visit  Medication Sig Dispense Refill  . aspirin 81 MG tablet Take 81 mg by mouth daily.    . Multiple Vitamin (MULTIVITAMIN WITH MINERALS) TABS tablet Take 1 tablet by mouth daily.     No current facility-administered  medications on file prior to visit.     BP 138/82 (BP Location: Right Arm, Patient Position: Sitting, Cuff Size: Normal)   Pulse 68   Temp 97.7 F (36.5 C) (Oral)   Wt 141 lb 12.8 oz (64.3 kg)   SpO2 98%   BMI 25.12 kg/m   Subsequent Medicare wellness visit  1. Risk factors, based on past  M,S,F history cardiovascular risk factors include a history of hypertension and dyslipidemia 2.  Physical activities: No exercise restrictions.  Remains active with walking and water aerobics  3.  Depression/mood: No history of major depression or mood disorder  4.  Hearing: No major deficits.  At times she feels she may have some difficulties with hearing the TV  5.  ADL's: Independent  6.  Fall risk: Low.  No falls over the past year.  Does feel that she takes an occasional misstep and tends to fall to the right  7.  Home safety: No problems identified  8.  Height  weight, and visual acuity; height and weight stable no change in visual acuity.  Is scheduled for a eye examination in the near future  9.  Counseling: Continue heart healthy diet.  Modest weight loss encouraged more rigorous activities also recommended  10. Lab orders based on risk factors: Laboratory update will be reviewed  11. Referral : Ophthalmology referral  12. Care plan: Continue efforts at aggressive risk factor modification.  Cologuard testing  13. Cognitive assessment: Alert and appropriate normal affect.  No cognitive dysfunction  14. Screening: Patient provided with a written and personalized 5-10 year screening schedule in the AVS.    15. Provider List Update: [facility]    Review of Systems  Constitutional: Negative.   HENT: Negative for congestion, dental problem, hearing loss, rhinorrhea, sinus pressure, sore throat and tinnitus.   Eyes: Negative for pain, discharge and visual disturbance.  Respiratory: Negative for cough and shortness of breath.   Cardiovascular: Negative for chest pain, palpitations and leg swelling.  Gastrointestinal: Negative for abdominal distention, abdominal pain, blood in stool, constipation, diarrhea, nausea and vomiting.  Genitourinary: Negative for difficulty urinating, dysuria, flank pain, frequency, hematuria, pelvic pain, urgency, vaginal bleeding, vaginal discharge and vaginal pain.  Musculoskeletal: Negative for arthralgias, gait problem and joint swelling.  Skin: Negative for rash.  Neurological: Negative for dizziness, syncope, speech difficulty, weakness, numbness and headaches.  Hematological: Negative for adenopathy.  Psychiatric/Behavioral: Negative for agitation, behavioral problems and dysphoric mood. The patient is not nervous/anxious.        Objective:   Physical Exam  Constitutional: She is oriented to person, place, and time. She appears well-developed and well-nourished.  HENT:  Head: Normocephalic  and atraumatic.  Right Ear: External ear normal.  Left Ear: External ear normal.  Mouth/Throat: Oropharynx is clear and moist.  Eyes: Conjunctivae and EOM are normal.  Neck: Normal range of motion. Neck supple. No JVD present. No thyromegaly present.  Cardiovascular: Normal rate, regular rhythm, normal heart sounds and intact distal pulses.  No murmur heard. Pulmonary/Chest: Effort normal and breath sounds normal. She has no wheezes. She has no rales.  Abdominal: Soft. Bowel sounds are normal. She exhibits no distension and no mass. There is no tenderness. There is no rebound and no guarding.  Genitourinary: Vagina normal.  Musculoskeletal: Normal range of motion. She exhibits no edema or tenderness.  Neurological: She is alert and oriented to person, place, and time. She has normal reflexes. She displays normal reflexes. No cranial nerve deficit.  She exhibits normal muscle tone. Coordination normal.  Skin: Skin is warm and dry. No rash noted.  Psychiatric: She has a normal mood and affect. Her behavior is normal.          Assessment & Plan:  Preventive health examination Subsequent Medicare wellness visit History of essential hypertension.  We will continue a home blood pressure monitoring.  Will consider retreating if blood pressures are consistently above 140/85  Cologuard testing Check screening lab History of mild dyslipidemia.  Will review a lipid profile  Kayla [name]   Addendum.  After further discussion it was elected to resume hydrochlorothiazide 12.5 mg daily   Kayla [name]

## 2018-04-25 ENCOUNTER — Encounter: Payer: Self-pay | Admitting: Internal Medicine

## 2018-05-08 ENCOUNTER — Encounter: Payer: Self-pay | Admitting: Family Medicine

## 2018-05-08 ENCOUNTER — Ambulatory Visit: Payer: Medicare Other | Admitting: Family Medicine

## 2018-05-08 VITALS — BP 124/84 | HR 78 | Temp 97.9°F | Wt 142.0 lb

## 2018-05-08 DIAGNOSIS — S92502A Displaced unspecified fracture of left lesser toe(s), initial encounter for closed fracture: Secondary | ICD-10-CM | POA: Diagnosis not present

## 2018-05-08 DIAGNOSIS — L03031 Cellulitis of right toe: Secondary | ICD-10-CM

## 2018-05-08 MED ORDER — CEPHALEXIN 500 MG PO CAPS
500.0000 mg | ORAL_CAPSULE | Freq: Three times a day (TID) | ORAL | 0 refills | Status: DC
Start: 2018-05-08 — End: 2018-09-17

## 2018-05-08 NOTE — Progress Notes (Signed)
OFFICE VISIT  05/08/2018   CC:  Chief Complaint  Patient presents with  . Toe Pain    3rd toe on right foot hit toe on chair... toe is swollen and red... also some warmth to touch...  some muscle soreness in calf... pt has tried Aspirin w/ no relief   HPI:    Patient is a 77 y.o. Caucasian female who presents for toe pain.  Stubbed R foot middle toe on edge of chair when moving furniture in home--12 days ago.  Initially it swelled and turned red.  The pain was moderate at first.  All sx's worsening as time goes on.  Altered gait has her hurting mildly in R upper calf. Otherwise is feeling well.  Can move toe ok. Has taken ASA 325mg  only 3-4 doses since.  Past Medical History:  Diagnosis Date  . CHEST WALL PAIN, ANTERIOR 07/26/2008  . HYPERLIPIDEMIA 06/01/2008  . HYPERTENSION 07/28/2007  . MENOPAUSAL SYNDROME 06/01/2008    Past Surgical History:  Procedure Laterality Date  . COLONOSCOPY    . NO PAST SURGERIES    . TOTAL HIP ARTHROPLASTY Right 07/04/2014   Procedure: RIGHT TOTAL HIP ARTHROPLASTY ANTERIOR APPROACH;  Surgeon: Sheral Apleyimothy D Murphy, MD;  Location: [facility];  Service: Orthopedics;  Laterality: Right;    Outpatient Medications Prior to Visit  Medication Sig Dispense Refill  . aspirin 81 MG tablet Take 81 mg by mouth daily.    . hydrochlorothiazide (HYDRODIURIL) 12.5 MG tablet Take 1 tablet (12.5 mg total) by mouth daily. 90 tablet 3  . Multiple Vitamin (MULTIVITAMIN WITH MINERALS) TABS tablet Take 1 tablet by mouth daily.     No facility-administered medications prior to visit.     No Known Allergies  ROS As per HPI  PE: Blood pressure 124/84, pulse 78, temperature 97.9 F (36.6 C), temperature source Oral, weight 142 lb (64.4 kg), SpO2 98 %. Gen: Alert, well appearing.  Patient is oriented to person, place, time, and situation. AFFECT: pleasant, lucid thought and speech. R foot: middle toe with TTP over prox phalanx, DIP joint, and distal phalanx.  She has swelling  that is mild and erythema around proximal nail fold.  No exudate from underneath nail fold.  Toenail is normal. ROM of toe intact.  No TTP of anywhere else on her foot.  LABS:    Chemistry      Component Value Date/Time   NA 140 04/19/2018 1005   K 4.0 04/19/2018 1005   CL 107 04/19/2018 1005   CO2 27 04/19/2018 1005   BUN 14 04/19/2018 1005   CREATININE 0.72 04/19/2018 1005      Component Value Date/Time   CALCIUM 10.1 04/19/2018 1005   ALKPHOS 109 04/19/2018 1005   AST 15 04/19/2018 1005   ALT 15 04/19/2018 1005   BILITOT 0.6 04/19/2018 1005      IMPRESSION AND PLAN:  Suspect fracture of distal and possible middle phalanx of middle toe on R foot. Needs x-ray to see if this extends into DIP joint. Unable to get x-ray of toe today.. Buddy taping done in office today and encouraged to continue until seen by her orthopedist early next week. Tylenol or ibuprofen for pain--stop aspirin. Since her erythema does appear the most intense around prox nail fold, I'll treat with keflex 500 tid x 7d for possible paronychia/early cellulitis.  An After Visit Summary was printed and given to the patient.  FOLLOW UP: Return for follow up with your orthopedist as soon as possible.  Signed:  Santiago Bumpers, MD           05/08/2018

## 2018-05-08 NOTE — Patient Instructions (Addendum)
Buddy tape your middle toe to the 2nd toe.  Stay off the R foot as much as possible for now. Look at pharmacy for this (on the shelf).

## 2018-05-09 LAB — COLOGUARD

## 2018-05-10 ENCOUNTER — Ambulatory Visit (HOSPITAL_COMMUNITY)
Admission: EM | Admit: 2018-05-10 | Discharge: 2018-05-10 | Disposition: A | Discharge status: Home / Self Care | Payer: Medicare Other | Attending: Family Medicine | Admitting: Family Medicine

## 2018-05-10 ENCOUNTER — Ambulatory Visit (INDEPENDENT_AMBULATORY_CARE_PROVIDER_SITE_OTHER): Payer: Medicare Other

## 2018-05-10 ENCOUNTER — Encounter (HOSPITAL_COMMUNITY): Payer: Self-pay | Admitting: Emergency Medicine

## 2018-05-10 DIAGNOSIS — M79674 Pain in right toe(s): Secondary | ICD-10-CM | POA: Diagnosis not present

## 2018-05-10 NOTE — Discharge Instructions (Signed)
Please continue to takes ibuprofen and tylenol. Ice toe.  Buddy tape  Finish antibiotic  Wear shoe for comfort

## 2018-05-10 NOTE — ED Triage Notes (Signed)
Pt states she stubbed her toe back in may, hasn't been seen for here and still is having pain. Requesting xray.

## 2018-05-10 NOTE — ED Provider Notes (Signed)
[facility]    CSN: [id number] Arrival date & time: 05/10/18  1052     History   Chief Complaint Chief Complaint  Patient presents with  . Toe Pain    HPI Kayla [name] is a 77 y.o. female history of hypertension, hyperlipidemia presenting today for evaluation of right toe pain.  Patient states that approximately 11 to 14 days ago she stubbed her toe on a chair, the next day her right third toe developed redness, swelling and pain.  She did not get it evaluated she was on vacation.  She went to her PCP over the weekend he was concerned about fracture as well as paronychia.  She has been buddy taping her toe as well as recently started Keflex.  Taking Tylenol for pain.  Feels that the redness has slightly improved.  Noticing some pain with weightbearing.  HPI  Past Medical History:  Diagnosis Date  . CHEST WALL PAIN, ANTERIOR 07/26/2008  . HYPERLIPIDEMIA 06/01/2008  . HYPERTENSION 07/28/2007  . MENOPAUSAL SYNDROME 06/01/2008    Patient Active Problem List   Diagnosis Date Noted  . DJD (degenerative joint disease) 07/04/2014  . CHEST WALL PAIN, ANTERIOR 07/26/2008  . Dyslipidemia 06/01/2008  . MENOPAUSAL SYNDROME 06/01/2008  . Essential hypertension 07/28/2007    Past Surgical History:  Procedure Laterality Date  . COLONOSCOPY    . NO PAST SURGERIES    . TOTAL HIP ARTHROPLASTY Right 07/04/2014   Procedure: RIGHT TOTAL HIP ARTHROPLASTY ANTERIOR APPROACH;  Surgeon: Sheral Apleyimothy D Murphy, MD;  Location: [facility];  Service: Orthopedics;  Laterality: Right;    OB History   None      Home Medications    Prior to Admission medications   Medication Sig Start Date End Date Taking? Authorizing Provider  aspirin 81 MG tablet Take 81 mg by mouth daily.    [provider]  cephALEXin (KEFLEX) 500 MG capsule Take 1 capsule (500 mg total) by mouth 3 (three) times daily. Patient not taking: Reported on 05/10/2018 05/08/18   Jeoffrey MassedMcGowen, Philip H, MD  hydrochlorothiazide  (HYDRODIURIL) 12.5 MG tablet Take 1 tablet (12.5 mg total) by mouth daily. 04/19/18   Gordy SaversKwiatkowski, Peter F, MD  Multiple Vitamin (MULTIVITAMIN WITH MINERALS) TABS tablet Take 1 tablet by mouth daily.    [provider]    Family History Family History  Problem Relation Age of Onset  . Cancer Mother        lung ca  . Heart disease Father     Social History Social History   Tobacco Use  . Smoking status: Never Smoker  . Smokeless tobacco: Never Used  Substance Use Topics  . Alcohol use: Yes    Comment: rare  . Drug use: No     Allergies   Patient has no known allergies.   Review of Systems Review of Systems  Constitutional: Negative for fatigue and fever.  Respiratory: Negative for shortness of breath.   Cardiovascular: Negative for chest pain.  Gastrointestinal: Negative for nausea and vomiting.  Musculoskeletal: Positive for arthralgias, joint swelling and myalgias. Negative for gait problem.  Skin: Positive for color change. Negative for rash and wound.  Neurological: Negative for dizziness, weakness, light-headedness and headaches.     Physical Exam Triage Vital Signs ED Triage Vitals  Enc Vitals Group     BP 05/10/18 1139 (!) 159/85     Pulse Rate 05/10/18 1138 72     Resp 05/10/18 1138 18     Temp 05/10/18 1138  98 F (36.7 C)     Temp src --      SpO2 05/10/18 1138 100 %     Weight --      Height --      Head Circumference --      Peak Flow --      Pain Score --      Pain Loc --      Pain Edu? --      Excl. in GC? --    No data found.  Updated Vital Signs BP (!) 159/85   Pulse 72   Temp 98 F (36.7 C)   Resp 18   SpO2 100%   Visual Acuity Right Eye Distance:   Left Eye Distance:   Bilateral Distance:    Right Eye Near:   Left Eye Near:    Bilateral Near:     Physical Exam  Constitutional: She is oriented to person, place, and time. She appears well-developed and well-nourished.  No acute distress  HENT:  Head:  Normocephalic and atraumatic.  Nose: Nose normal.  Eyes: Conjunctivae are normal.  Neck: Neck supple.  Cardiovascular: Normal rate.  Pulmonary/Chest: Effort normal. No respiratory distress.  Abdominal: She exhibits no distension.  Musculoskeletal: Normal range of motion.  Right third toe with erythema and swelling around nail fold, mild tenderness to palpation over lateral aspect of toe, no discoloration over metatarsals or tenderness to first through fifth metatarsal.  Ambulating with slight antalgia. Dorsalis pedis 2+  Neurological: She is alert and oriented to person, place, and time.  Skin: Skin is warm and dry.  Psychiatric: She has a normal mood and affect.  Nursing note and vitals reviewed.    UC Treatments / Results  Labs (all labs ordered are listed, but only abnormal results are displayed) Labs Reviewed - No data to display  EKG None  Radiology Dg Foot Complete Right  Result Date: 05/10/2018 CLINICAL DATA:  Right third toe pain after injury 10 days ago. EXAM: RIGHT FOOT COMPLETE - 3+ VIEW COMPARISON:  None. FINDINGS: There is no evidence of fracture or dislocation. There is no evidence of arthropathy or other focal bone abnormality. Soft tissues are unremarkable. IMPRESSION: No significant abnormality seen in the right foot. Electronically Signed   By: Lupita Raider, M.D.   On: 05/10/2018 12:26    Procedures Procedures (including critical care time)  Medications Ordered in UC Medications - No data to display  Initial Impression / Assessment and Plan / UC Course  I have reviewed the triage vital signs and the nursing notes.  Pertinent labs & imaging results that were available during my care of the patient were reviewed by me and considered in my medical decision making (see chart for details).     No fracture observed on x-ray.  Likely persistent sprain/jam/contusion of toe versus paronychia.  Will have continue antibiotics, anti-inflammatories, ice, buddy tape  as well as wearing postop shoe.Discussed strict return precautions. Patient verbalized understanding and is agreeable with plan.  Final Clinical Impressions(s) / UC Diagnoses   Final diagnoses:  Toe pain, right     Discharge Instructions     Please continue to takes ibuprofen and tylenol. Ice toe.  Buddy tape  Finish antibiotic  Wear shoe for comfort   ED Prescriptions    None     Controlled Substance Prescriptions [place] Controlled Substance Registry consulted? Not Applicable   Lew Dawes, [place] 05/10/18 1244

## 2018-05-11 ENCOUNTER — Ambulatory Visit: Payer: Medicare Other | Admitting: Internal Medicine

## 2018-06-07 ENCOUNTER — Encounter: Payer: Self-pay | Admitting: Internal Medicine

## 2018-09-17 ENCOUNTER — Ambulatory Visit: Payer: Medicare Other | Admitting: Family Medicine

## 2018-09-17 ENCOUNTER — Encounter: Payer: Self-pay | Admitting: Family Medicine

## 2018-09-17 VITALS — BP 118/80 | HR 70 | Temp 97.7°F | Wt 143.0 lb

## 2018-09-17 DIAGNOSIS — R1031 Right lower quadrant pain: Secondary | ICD-10-CM

## 2018-09-17 LAB — POC URINALSYSI DIPSTICK (AUTOMATED)
Bilirubin, UA: NEGATIVE
GLUCOSE UA: NEGATIVE
Ketones, UA: NEGATIVE
Leukocytes, UA: NEGATIVE
NITRITE UA: NEGATIVE
PROTEIN UA: NEGATIVE
RBC UA: NEGATIVE
Spec Grav, UA: 1.015 (ref 1.010–1.025)
UROBILINOGEN UA: 0.2 U/dL
pH, UA: 6.5 (ref 5.0–8.0)

## 2018-09-17 NOTE — Patient Instructions (Signed)

## 2018-09-17 NOTE — Progress Notes (Signed)
Subjective:    Patient ID: Kayla [name], female    DOB: Nov 14, 1941, 77 y.o.   MRN: [id number]  No chief complaint on file.   HPI Patient was seen today for acute concern.  Pt endorses RLQ pain x 2 wks.  Pain noted as a "gripping"  5/10 at baseline increasing to a 7/10, worse in the evening and at night.  Pt has not taken anything for her symptoms.  Pt does not recall injury, denies dysuria, back pain, frequency, pain that moves, fever, N/V, chills.  Does water aerobics.  Endorses constipation 4-5 days ago.  Last BM was today, did not strain. Pt has a h/o R THR 5 yrs ago.  Past Medical History:  Diagnosis Date  . CHEST WALL PAIN, ANTERIOR 07/26/2008  . HYPERLIPIDEMIA 06/01/2008  . HYPERTENSION 07/28/2007  . MENOPAUSAL SYNDROME 06/01/2008    No Known Allergies  ROS General: Denies fever, chills, night sweats, changes in weight, changes in appetite HEENT: Denies headaches, ear pain, changes in vision, rhinorrhea, sore throat CV: Denies CP, palpitations, SOB, orthopnea Pulm: Denies SOB, cough, wheezing GI: Denies nausea, vomiting, diarrhea, constipation  +RLQ abdominal pain GU: Denies dysuria, hematuria, frequency, vaginal discharge Msk: Denies muscle cramps, joint pains Neuro: Denies weakness, numbness, tingling Skin: Denies rashes, bruising Psych: Denies depression, anxiety, hallucinations    Objective:    Blood pressure 118/80, pulse 70, temperature 97.7 F (36.5 C), temperature source Oral, weight 143 lb (64.9 kg), SpO2 97 %.   Gen. Pleasant, well-nourished, in no distress, normal affect   Lungs: no accessory muscle use Cardiovascular: RRR, no peripheral edema Abdomen: BS present, soft, NT/ND, no hepatosplenomegaly. Neuro:  A&Ox3, CN II-XII intact, normal gait  Wt Readings from Last 3 Encounters:  09/17/18 143 lb (64.9 kg)  05/08/18 142 lb (64.4 kg)  04/19/18 141 lb 12.8 oz (64.3 kg)    Lab Results  Component Value Date   WBC 6.0 04/19/2018   HGB 14.8 04/19/2018   HCT  44.0 04/19/2018   PLT 265.0 04/19/2018   GLUCOSE 97 04/19/2018   CHOL 230 (H) 04/19/2018   TRIG 194.0 (H) 04/19/2018   HDL 45.00 04/19/2018   LDLDIRECT 160.0 03/30/2015   LDLCALC 147 (H) 04/19/2018   ALT 15 04/19/2018   AST 15 04/19/2018   NA 140 04/19/2018   K 4.0 04/19/2018   CL 107 04/19/2018   CREATININE 0.72 04/19/2018   BUN 14 04/19/2018   CO2 27 04/19/2018   TSH 1.51 04/19/2018   INR 0.97 06/22/2014    Assessment/Plan:  RLQ abdominal pain  -UA negative -referral placed for RLQ u/s -given handout. -Given RTC or ED precautions. - Plan: POCT Urinalysis Dipstick (Automated), US Abdomen Limited, CBC  F/u prn  Abbe Amsterdam, MD

## 2018-09-21 ENCOUNTER — Telehealth: Payer: Self-pay | Admitting: *Deleted

## 2018-09-21 NOTE — Telephone Encounter (Signed)
Copied from CRM #[id number]. Topic: General - Other >> Sep 21, 2018  9:16 AM Poole, Shalonda wrote: Reason for CRM: Patient is requesting a call back in regards to her referral . Please advise >> Sep 21, 2018  3:33 PM Dawkins, Deborah A wrote: Per  From: Cox, Sheena H, CMA Sent: 09/20/2018   1:36 PM EST To: Nancy N Kigotho, CMA Subject: still need to know if needs to be US or CT, *  awaiting  Dr to responde >> Sep 21, 2018  4:18 PM Alexander, Amber L wrote: Pt calling to check on this again.  

## 2018-09-22 ENCOUNTER — Encounter: Payer: Self-pay | Admitting: Family Medicine

## 2018-09-22 DIAGNOSIS — R1084 Generalized abdominal pain: Secondary | ICD-10-CM

## 2018-09-22 NOTE — Telephone Encounter (Signed)
Copied from CRM [id number]. Topic: General - Other >> Sep 21, 2018  9:16 AM Gaynelle Adu wrote: Reason for CRM: Patient is requesting a call back in regards to her referral . Please advise >> Sep 21, 2018  3:33 PM Governor Specking wrote: Per  From: Vito Backers, CMA Sent: 09/20/2018   1:36 PM EST To: Carola Rhine, CMA Subject: still need to know if needs to be [place] or [place], *  awaiting  Dr to responde >> Sep 21, 2018  4:18 PM Lyn Hollingshead, [facility] wrote: Pt calling to check on this again.

## 2018-09-24 ENCOUNTER — Other Ambulatory Visit: Payer: Self-pay | Admitting: Internal Medicine

## 2018-11-15 ENCOUNTER — Ambulatory Visit (HOSPITAL_COMMUNITY)
Admission: EM | Admit: 2018-11-15 | Discharge: 2018-11-15 | Disposition: A | Discharge status: Home / Self Care | Payer: Medicare Other | Attending: Physician Assistant | Admitting: Physician Assistant

## 2018-11-15 ENCOUNTER — Ambulatory Visit (INDEPENDENT_AMBULATORY_CARE_PROVIDER_SITE_OTHER): Payer: Medicare Other

## 2018-11-15 ENCOUNTER — Encounter (HOSPITAL_COMMUNITY): Payer: Self-pay

## 2018-11-15 ENCOUNTER — Telehealth: Payer: Self-pay | Admitting: *Deleted

## 2018-11-15 DIAGNOSIS — M79601 Pain in right arm: Secondary | ICD-10-CM | POA: Insufficient documentation

## 2018-11-15 NOTE — Discharge Instructions (Signed)
Xray negative for fracture, bone lesions. Start ibuprofen 400mg  three times a day to help with the pain. No strenuous activity. Follow up with PCP/orthopedics for further evaluation if symptoms still not improving in 7-10 days.

## 2018-11-15 NOTE — ED Provider Notes (Signed)
[facility]    CSN: [id number] Arrival date & time: 11/15/18  1038     History   Chief Complaint Chief Complaint  Patient presents with  . Arm Pain    Right     HPI Kayla [name] is a 77 y.o. female.   77 year old female comes in for 1 month history of right upper arm pain.  States had bumped the arm about a month ago, had significant bruising then.  However, swelling and bruising has since resolved, and pain has continued despite ice compress, heat compress, occasional ibuprofen.  States pain is mostly to the biceps, but with external rotation, can have pain radiating to the shoulder.  Denies pain at rest.  Denies numbness, tingling, loss of grip strength or dexterity.  Has continued to do water aerobics without much difficulty.      Past Medical History:  Diagnosis Date  . CHEST WALL PAIN, ANTERIOR 07/26/2008  . HYPERLIPIDEMIA 06/01/2008  . HYPERTENSION 07/28/2007  . MENOPAUSAL SYNDROME 06/01/2008    Patient Active Problem List   Diagnosis Date Noted  . DJD (degenerative joint disease) 07/04/2014  . CHEST WALL PAIN, ANTERIOR 07/26/2008  . Dyslipidemia 06/01/2008  . MENOPAUSAL SYNDROME 06/01/2008  . Essential hypertension 07/28/2007    Past Surgical History:  Procedure Laterality Date  . COLONOSCOPY    . NO PAST SURGERIES    . 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      Home Medications    Prior to Admission medications   Medication Sig Start Date End Date Taking? Authorizing Provider  aspirin 81 MG tablet Take 81 mg by mouth daily.    [provider]  hydrochlorothiazide (HYDRODIURIL) 12.5 MG tablet Take 1 tablet (12.5 mg total) by mouth daily. 04/19/18   Gordy SaversKwiatkowski, Peter F, MD  Multiple Vitamin (MULTIVITAMIN WITH MINERALS) TABS tablet Take  1 tablet by mouth daily.    [provider]    Family History Family History  Problem Relation Age of Onset  . Cancer Mother        lung ca  . Heart disease Father     Social History Social History   Tobacco Use  . Smoking status: Never Smoker  . Smokeless tobacco: Never Used  Substance Use Topics  . Alcohol use: Yes    Comment: rare  . Drug use: No     Allergies   Patient has no known allergies.   Review of Systems Review of Systems  Reason unable to perform ROS: See HPI as above.     Physical Exam Triage Vital Signs ED Triage Vitals  Enc Vitals Group     BP 11/15/18 1247 (!) 145/86     Pulse Rate 11/15/18 1247 80     Resp 11/15/18 1247 16     Temp 11/15/18 1247 97.6 F (36.4 C)     Temp Source 11/15/18 1247 Oral     SpO2 11/15/18 1247 96 %     Weight --      Height --      Head Circumference --      Peak Flow --      Pain Score 11/15/18 1248 9     Pain Loc --      Pain Edu? --      Excl.  in GC? --    No data found.  Updated Vital Signs BP (!) 145/86 (BP Location: Right Arm)   Pulse 80   Temp 97.6 F (36.4 C) (Oral)   Resp 16   SpO2 96%   Physical Exam Constitutional:      General: Kayla [name] is not in acute distress.    Appearance: Kayla [name] is well-developed. Kayla [name] is not ill-appearing, toxic-appearing or diaphoretic.  HENT:     Head: Normocephalic and atraumatic.  Eyes:     Conjunctiva/sclera: Conjunctivae normal.     Pupils: Pupils are equal, round, and reactive to light.  Musculoskeletal:     Comments: No obvious erythema, warmth, contusion, swelling.  Tenderness to palpation along bicep without deformity seen.  Full range of motion of neck, shoulder, elbow, wrist, fingers.  Patient expressing pain with internal and external rotation of shoulder, pointing to distal biceps region.  Strength normal and equal bilaterally.  Sensation intact and equal bilaterally.  Radial pulse 2+, cap refill less than 2 seconds.  Neurological:     Mental Status:  Kayla [name] is alert and oriented to person, place, and time.      UC Treatments / Results  Labs (all labs ordered are listed, but only abnormal results are displayed) Labs Reviewed - No data to display  EKG None  Radiology Dg Humerus Right  Result Date: 11/15/2018 CLINICAL DATA:  Posttraumatic right arm pain for 1 month EXAM: RIGHT HUMERUS - 2+ VIEW COMPARISON:  None. FINDINGS: There is no evidence of fracture or other focal bone lesions. Osteopenic appearance. Soft tissues are unremarkable. IMPRESSION: No acute finding.  Negative for fracture. Electronically Signed   By: Marnee SpringJonathon  Watts M.D.   On: 11/15/2018 13:48    Procedures Procedures (including critical care time)  Medications Ordered in UC Medications - No data to display  Initial Impression / Assessment and Plan / UC Course  I have reviewed the triage vital signs and the nursing notes.  Pertinent labs & imaging results that were available during my care of the patient were reviewed by me and considered in my medical decision making (see chart for details).    Discussed possible tendinitis causing symptoms.  Patient requesting x-ray, discussed indications for x-ray, given age, will obtain x-ray to evaluate for bone lesions, low suspicion for fracture.  Xray negative. Offered mobic vs prednisone for inflammation. Patient would like to continue ibuprofen, has only been taking 200mg  every few days. Will have patient start ibuprofen 400mg  three times a day. Return precautions given. Patient expresses understanding and agrees to plan.  Final Clinical Impressions(s) / UC Diagnoses   Final diagnoses:  Right arm pain    ED Prescriptions    None        Belinda FisherYu, Amy V, PA-C 11/15/18 1446

## 2018-11-15 NOTE — ED Triage Notes (Addendum)
Pt present right arm pain, started about 1 month ago.

## 2018-11-15 NOTE — Telephone Encounter (Signed)
FYI Patient was a walk-in to the office.  She is complaining of right shoulder pain that has been consistent for a month.  Patient has tried Tylenol for the pain with little relief.  Dr SwazilandJordan does not have any available appointments today.  Appointment offered for 11/16/18 and gave information about [facility].  Patient will call [facility] and has scheduled an appointment on 11/16/18 with Dr SwazilandJordan.

## 2018-11-15 NOTE — Telephone Encounter (Signed)
Message sent to Dr. Jordan for review. 

## 2018-11-16 ENCOUNTER — Ambulatory Visit: Payer: Medicare Other | Admitting: Family Medicine

## 2019-03-22 ENCOUNTER — Ambulatory Visit (INDEPENDENT_AMBULATORY_CARE_PROVIDER_SITE_OTHER): Payer: Medicare Other | Admitting: Family Medicine

## 2019-03-22 ENCOUNTER — Encounter: Payer: Self-pay | Admitting: Family Medicine

## 2019-03-22 ENCOUNTER — Other Ambulatory Visit: Payer: Self-pay

## 2019-03-22 VITALS — BP 148/89 | HR 80 | Resp 12

## 2019-03-22 DIAGNOSIS — E782 Mixed hyperlipidemia: Secondary | ICD-10-CM | POA: Diagnosis not present

## 2019-03-22 DIAGNOSIS — I10 Essential (primary) hypertension: Secondary | ICD-10-CM

## 2019-03-22 NOTE — Progress Notes (Signed)
Virtual Visit via Video Note   I connected with [name] on 03/22/19 at  9:00 AM EDT by a video enabled telemedicine application and verified that I am speaking with the correct person using two identifiers.  Location patient: home Location provider:home office Persons participating in the virtual visit: patient, provider  I discussed the limitations of evaluation and management by telemedicine and the availability of in person appointments She expressed understanding and agreed to proceed.   HPI: Ms. Kayla [name] is a 78 years old female with history of hypertension who is being seen today to establish care. Former PCP: Dr. Amador [name]. Last CPE in 04/2018.   Diagnosed with hypertension around 2008. Currently she is on HCTZ 12.5 mg daily. BP has been mildly elevated for the past couple days: 140-150's/80's. Denies severe/frequent headache, visual changes, chest pain, dyspnea, palpitation, claudication, focal weakness, or edema.  She has an appointment with her eye care provider in 04/2019.  Lab Results  Component Value Date   CREATININE 0.72 04/19/2018   BUN 14 04/19/2018   NA 140 04/19/2018   K 4.0 04/19/2018   CL 107 04/19/2018   CO2 27 04/19/2018   Hyperlipidemia, currently she is on nonpharmacologic treatment. She exercises regularly, and until gyms were closed she was doing aquatic exercises.  Now she is walking twice per day and doing stationary bike. She follows a healthful diet.  Lab Results  Component Value Date   CHOL 230 (H) 04/19/2018   HDL 45.00 04/19/2018   LDLCALC 147 (H) 04/19/2018   LDLDIRECT 160.0 03/30/2015   TRIG 194.0 (H) 04/19/2018   CHOLHDL 5 04/19/2018   Currently she is on Aspirin 81 mg daily. She denies any history of CVD, atrial fibrillation, or thrombotic episodes.   ROS: See pertinent positives and negatives per HPI.  Past Medical History:  Diagnosis Date  . CHEST WALL PAIN, ANTERIOR 07/26/2008  . HYPERLIPIDEMIA 06/01/2008  . HYPERTENSION  07/28/2007  . MENOPAUSAL SYNDROME 06/01/2008    Past Surgical History:  Procedure Laterality Date  . COLONOSCOPY    . NO PAST SURGERIES    . 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 Cancer Mother        lung ca  . Heart disease Father     Social History   Socioeconomic History  . Marital status: Married    Spouse name: Not on file  . Number of children: Not on file  . Years of education: Not on file  . Highest education level: Not on file  Occupational History  . Not on file  Social Needs  . Financial resource strain: Not on file  . Food insecurity:    Worry: Not on file    Inability: Not on file  . Transportation needs:    Medical: Not on file    Non-medical: Not on file  Tobacco Use  . Smoking status: Never Smoker  . Smokeless tobacco: Never Used  Substance and Sexual Activity  . Alcohol use: Yes    Comment: rare  . Drug use: No  . Sexual activity: Not on file  Lifestyle  . Physical activity:    Days per week: Not on file    Minutes per session: Not on file  . Stress: Not on file  Relationships  . Social connections:  Talks on phone: Not on file    Gets together: Not on file    Attends religious service: Not on file    Active member of club or organization: Not on file    Attends meetings of clubs or organizations: Not on file    Relationship status: Not on file  . Intimate partner violence:    Fear of current or ex partner: Not on file    Emotionally abused: Not on file    Physically abused: Not on file    Forced sexual activity: Not on file  Other Topics Concern  . Not on file  Social History Narrative  . Not on file      Current Outpatient Medications:  .  aspirin 81 MG tablet, Take 81 mg by mouth daily., Disp: , Rfl:  .   hydrochlorothiazide (HYDRODIURIL) 12.5 MG tablet, Take 1 tablet (12.5 mg total) by mouth daily., Disp: 90 tablet, Rfl: 3 .  Multiple Vitamin (MULTIVITAMIN WITH MINERALS) TABS tablet, Take 1 tablet by mouth daily., Disp: , Rfl:   EXAM:  VITALS per patient if applicable:BP (!) 148/89   Pulse 80   Resp 12   GENERAL: alert, oriented, appears well and in no acute distress  HEENT: atraumatic, conjunctiva clear, intact EOM, and no obvious facial abnormalities on inspection.  NECK: normal movements of the head and neck  LUNGS: on inspection no signs of respiratory distress, breathing rate appears normal, no obvious gross SOB, gasping or wheezing  CV: no obvious cyanosis  MS: moves all visible extremities without noticeable abnormality  PSYCH/NEURO: pleasant and cooperative, no obvious depression or anxiety, speech and thought processing grossly intact  ASSESSMENT AND PLAN:  Discussed the following assessment and plan:  Essential hypertension Today BP is not adequately controlled. During prior visits her BP has been in upper normal range. We discussed possible complications of elevated BP. She prefers to hold on adding new medication. Recommend checking BP daily and to let me know the readings in 2 weeks. Continue low-salt diet. Keep next eye appointment. Instructed about warning signs.  Hyperlipidemia, mixed For now she will continue nonpharmacologic treatment. We will plan on repeating lipid panel next visit and make recommendations in regard to pharmacologic treatment according to results.  25 min face to face OV. > 50% was dedicated to discussion of Dx, prognosis, treatment options, and some side effects of medications. In regard to Aspirin 81 mg, we discussed side effects as well as current recommendations in regard to primary prevention.  Given the fact she does not have history of CVD or thrombotic even, I think she can stopping it.   I discussed the assessment and treatment  plan with the patient. She was provided an opportunity to ask questions and all were answered. The patient agreed with the plan and demonstrated an understanding of the instructions.     Return in about 3 months (around 06/22/2019) for cpe and f/u.    Arlena Marsan [place], MD

## 2019-03-22 NOTE — Assessment & Plan Note (Signed)
For now she will continue nonpharmacologic treatment. We will plan on repeating lipid panel next visit and make recommendations in regard to pharmacologic treatment according to results.

## 2019-03-22 NOTE — Assessment & Plan Note (Signed)
Today BP is not adequately controlled. During prior visits her BP has been in upper normal range. We discussed possible complications of elevated BP. She prefers to hold on adding new medication. Recommend checking BP daily and to let me know the readings in 2 weeks. Continue low-salt diet. Keep next eye appointment. Instructed about warning signs.

## 2019-04-13 ENCOUNTER — Telehealth: Payer: Self-pay | Admitting: Family Medicine

## 2019-04-13 ENCOUNTER — Other Ambulatory Visit: Payer: Self-pay | Admitting: *Deleted

## 2019-04-13 DIAGNOSIS — I10 Essential (primary) hypertension: Secondary | ICD-10-CM

## 2019-04-13 MED ORDER — HYDROCHLOROTHIAZIDE 12.5 MG PO TABS: 12.5000 mg | ORAL_TABLET | Freq: Every day | ORAL | 3 refills | Status: DC

## 2019-04-13 NOTE — Telephone Encounter (Signed)
Copied from CRM [id number]. Topic: Quick Communication - Rx Refill/Question >> Apr 13, 2019  1:57 PM Crist Infante wrote: Medication: hydrochlorothiazide (HYDRODIURIL) 12.5 MG tablet  Pt states she called the pharmacy last week and this med still has not been refilled. Pt used to see Dr Kirtland Bouchard.  Pt states she is needing. I do not see where we got request.  Please send 90 day to  [id number] - [place], [place] - [address] AT [facility] [address] [phone] (Phone) [phone] (Fax)

## 2019-04-13 NOTE — Telephone Encounter (Signed)
Rx refill sent to patient pharmacy. Nothing further is needed

## 2019-05-27 ENCOUNTER — Other Ambulatory Visit: Payer: Self-pay | Admitting: *Deleted

## 2019-05-27 DIAGNOSIS — I10 Essential (primary) hypertension: Secondary | ICD-10-CM

## 2019-05-31 MED ORDER — HYDROCHLOROTHIAZIDE 12.5 MG PO TABS: 12.5000 mg | ORAL_TABLET | Freq: Every day | ORAL | 3 refills | Status: DC

## 2019-08-02 ENCOUNTER — Encounter: Payer: Self-pay | Admitting: Family Medicine

## 2019-12-09 ENCOUNTER — Ambulatory Visit: Payer: Medicare PPO | Attending: Internal Medicine

## 2019-12-09 DIAGNOSIS — Z23 Encounter for immunization: Secondary | ICD-10-CM | POA: Insufficient documentation

## 2019-12-09 NOTE — Progress Notes (Signed)
   Covid-19 Vaccination Clinic  Name:  Kayla [name]    MRN: [id number] DOB: November 26, 1940  12/09/2019  [name] was observed post Covid-19 immunization for 15 minutes without incidence. She was provided with Vaccine Information Sheet and instruction to access the V-Safe system.   [name] was instructed to call 911 with any severe reactions post vaccine: Marland Kitchen Difficulty breathing  . Swelling of your face and throat  . A fast heartbeat  . A bad rash all over your body  . Dizziness and weakness    Immunizations Administered    Name Date Dose VIS Date Route   Pfizer COVID-19 Vaccine 12/09/2019 11:39 AM 0.3 mL 10/28/2019 Intramuscular   Manufacturer: [employer], [employer]   Lot: [id number]   NDC: [address]-7

## 2019-12-27 ENCOUNTER — Ambulatory Visit: Payer: Medicare PPO | Attending: Internal Medicine

## 2019-12-27 DIAGNOSIS — Z23 Encounter for immunization: Secondary | ICD-10-CM | POA: Insufficient documentation

## 2019-12-27 NOTE — Progress Notes (Signed)
   Covid-19 Vaccination Clinic  Name:  [name]    MRN: [id number] DOB: 1941/08/15  12/27/2019  [name] was observed post Covid-19 immunization for 15 minutes without incidence. She was provided with Vaccine Information Sheet and instruction to access the V-Safe system.   [name] was instructed to call 911 with any severe reactions post vaccine: Marland Kitchen Difficulty breathing  . Swelling of your face and throat  . A fast heartbeat  . A bad rash all over your body  . Dizziness and weakness    Immunizations Administered    Name Date Dose VIS Date Route   Pfizer COVID-19 Vaccine 12/27/2019  8:57 AM 0.3 mL 10/28/2019 Intramuscular   Manufacturer: [employer], [employer]   Lot: [id number]   NDC: [address]-1

## 2020-02-23 ENCOUNTER — Encounter: Payer: Self-pay | Admitting: Family Medicine

## 2020-03-16 LAB — HM DIABETES EYE EXAM

## 2020-03-20 ENCOUNTER — Encounter: Payer: Self-pay | Admitting: Family Medicine

## 2020-06-04 ENCOUNTER — Telehealth: Payer: Self-pay | Admitting: Family Medicine

## 2020-06-04 DIAGNOSIS — E782 Mixed hyperlipidemia: Secondary | ICD-10-CM

## 2020-06-04 DIAGNOSIS — I10 Essential (primary) hypertension: Secondary | ICD-10-CM

## 2020-06-04 NOTE — Telephone Encounter (Signed)
Pt have CPE appt on 8/10 and would like to have labs done prior to appt. Need lab orders in for routine yearly labs.

## 2020-06-05 NOTE — Telephone Encounter (Signed)
Orders placed.

## 2020-06-05 NOTE — Addendum Note (Signed)
Addended by: Kathreen Devoid on: 06/05/2020 09:10 AM   Modules accepted: Orders

## 2020-06-14 ENCOUNTER — Ambulatory Visit: Payer: Medicare PPO

## 2020-06-14 ENCOUNTER — Other Ambulatory Visit: Payer: Self-pay

## 2020-06-20 ENCOUNTER — Other Ambulatory Visit: Payer: Self-pay

## 2020-06-20 ENCOUNTER — Other Ambulatory Visit: Payer: Medicare PPO

## 2020-06-20 DIAGNOSIS — I10 Essential (primary) hypertension: Secondary | ICD-10-CM

## 2020-06-20 DIAGNOSIS — E782 Mixed hyperlipidemia: Secondary | ICD-10-CM

## 2020-06-20 NOTE — Addendum Note (Signed)
Addended by: Lerry Liner on: 06/20/2020 08:59 AM   Modules accepted: Orders

## 2020-06-21 LAB — CBC WITH DIFFERENTIAL/PLATELET
Absolute Monocytes: 606 cells/uL (ref 200–950)
Basophils Absolute: 48 cells/uL (ref 0–200)
Basophils Relative: 0.8 %
Eosinophils Absolute: 162 cells/uL (ref 15–500)
Eosinophils Relative: 2.7 %
HCT: 44.8 % (ref 35.0–45.0)
Hemoglobin: 14.8 g/dL (ref 11.7–15.5)
Lymphs Abs: 1572 cells/uL (ref 850–3900)
MCH: 31.5 pg (ref 27.0–33.0)
MCHC: 33 g/dL (ref 32.0–36.0)
MCV: 95.3 fL (ref 80.0–100.0)
MPV: 10.7 fL (ref 7.5–12.5)
Monocytes Relative: 10.1 %
Neutro Abs: 3612 cells/uL (ref 1500–7800)
Neutrophils Relative %: 60.2 %
Platelets: 269 10*3/uL (ref 140–400)
RBC: 4.7 10*6/uL (ref 3.80–5.10)
RDW: 12.7 % (ref 11.0–15.0)
Total Lymphocyte: 26.2 %
WBC: 6 10*3/uL (ref 3.8–10.8)

## 2020-06-21 LAB — COMPREHENSIVE METABOLIC PANEL
AG Ratio: 1.3 (calc) (ref 1.0–2.5)
ALT: 30 U/L — ABNORMAL HIGH (ref 6–29)
AST: 26 U/L (ref 10–35)
Albumin: 4 g/dL (ref 3.6–5.1)
Alkaline phosphatase (APISO): 86 U/L (ref 37–153)
BUN: 12 mg/dL (ref 7–25)
CO2: 26 mmol/L (ref 20–32)
Calcium: 10.3 mg/dL (ref 8.6–10.4)
Chloride: 106 mmol/L (ref 98–110)
Creat: 0.78 mg/dL (ref 0.60–0.93)
Globulin: 3.1 g/dL (calc) (ref 1.9–3.7)
Glucose, Bld: 97 mg/dL (ref 65–99)
Potassium: 4 mmol/L (ref 3.5–5.3)
Sodium: 140 mmol/L (ref 135–146)
Total Bilirubin: 0.5 mg/dL (ref 0.2–1.2)
Total Protein: 7.1 g/dL (ref 6.1–8.1)

## 2020-06-21 LAB — LIPID PANEL
Cholesterol: 252 mg/dL — ABNORMAL HIGH (ref <200)
HDL: 50 mg/dL (ref >=50)
LDL Cholesterol (Calc): 157 mg/dL (calc) — ABNORMAL HIGH
Non-HDL Cholesterol (Calc): 202 mg/dL (calc) — ABNORMAL HIGH (ref <130)
Total CHOL/HDL Ratio: 5 (calc) — ABNORMAL HIGH (ref <5.0)
Triglycerides: 274 mg/dL — ABNORMAL HIGH (ref <150)

## 2020-06-21 LAB — TSH: TSH: 1.8 mIU/L (ref 0.40–4.50)

## 2020-06-25 NOTE — Patient Instructions (Addendum)
  Kayla [name] , Thank you for taking time to come for your Medicare Wellness Visit. I appreciate your ongoing commitment to your health goals. Please review the following plan we discussed and let me know if I can assist you in the future.   These are the goals we discussed: Goals   None     This is a list of the screening recommended for you and due dates:  Health Maintenance  Topic Date Due  .  Hepatitis C: One time screening is recommended by Center for Disease Control  (CDC) for  adults born from [birth year] through 1966.   Never done  . DEXA scan (bone density measurement)  Never done  . Flu Shot  09/25/2020*  . Tetanus Vaccine  06/14/2030  . COVID-19 Vaccine  Completed  . Pneumonia vaccines  Completed  *Topic was postponed. The date shown is not the original due date.   If we have ordered labs or studies at this visit, it can take up to 1-2 weeks for results and processing. IF results require follow up or explanation, we will call you with instructions. Clinically stable results will be released to your [facility]. If you have not heard from [place] or cannot find your results in [facility] in 2 weeks please contact our office at [phone].  If you are not yet signed up for [facility], please consider signing up.  A few tips:  -As we age balance is not as good as it was, so there is a higher risks for falls. Please remove small rugs and furniture that is "in your way" and could increase the risk of falls. Stretching exercises may help with fall prevention: Yoga and Tai Chi are some examples. Low impact exercise is better, so you are not very achy the next day.  -Sun screen and avoidance of direct sun light recommended. Caution with dehydration, if working outdoors be sure to drink enough fluids.  - Some medications are not safe as we age, increases the risk of side effects and can potentially interact with other medication you are also taken;  including some of over the counter medications. Be sure  to let me know when you start a new medication even if it is a dietary/vitamin supplement.   -Healthy diet low in red meet/animal fat and sugar + regular physical activity is recommended.  A few things to remember from today's visit:   Hyperlipidemia, mixed  Essential hypertension  Medicare annual wellness visit, subsequent  Routine general medical examination at a health care facility  If you need refills please call your pharmacy. Do not use My Chart to request refills or for acute issues that need immediate attention.   Today we are starting Crestor 5 mg daily for cardiovascular protection and cholesterol. Please be sure medication list is accurate. If a new problem present, please set up appointment sooner than planned today.

## 2020-06-26 ENCOUNTER — Encounter: Payer: Self-pay | Admitting: Family Medicine

## 2020-06-26 ENCOUNTER — Ambulatory Visit (INDEPENDENT_AMBULATORY_CARE_PROVIDER_SITE_OTHER): Payer: Medicare PPO | Admitting: Family Medicine

## 2020-06-26 ENCOUNTER — Other Ambulatory Visit: Payer: Self-pay

## 2020-06-26 VITALS — BP 124/70 | HR 74 | Temp 97.4°F | Resp 16 | Ht 63.0 in | Wt 140.0 lb

## 2020-06-26 DIAGNOSIS — I10 Essential (primary) hypertension: Secondary | ICD-10-CM

## 2020-06-26 DIAGNOSIS — E782 Mixed hyperlipidemia: Secondary | ICD-10-CM | POA: Diagnosis not present

## 2020-06-26 DIAGNOSIS — Z0000 Encounter for general adult medical examination without abnormal findings: Secondary | ICD-10-CM | POA: Diagnosis not present

## 2020-06-26 MED ORDER — ROSUVASTATIN CALCIUM 5 MG PO TABS: 5.0000 mg | ORAL_TABLET | Freq: Every day | ORAL | 1 refills | Status: DC

## 2020-06-26 NOTE — Progress Notes (Signed)
HPI: [name] is a 79 y.o. female, who is here today for her AWV and routine physical.  Last AWV on 04/19/18. She lives with her husband.. Independent ADL's and IADL's. No falls in the past year and denies depression symptoms.  Functional Status Survey: Is the patient deaf or have difficulty hearing?: No Does the patient have difficulty seeing, even when wearing glasses/contacts?: No Does the patient have difficulty concentrating, remembering, or making decisions?: No Does the patient have difficulty walking or climbing stairs?: No Does the patient have difficulty dressing or bathing?: No Does the patient have difficulty doing errands alone such as visiting a doctor's office or shopping?: No  Fall Risk  06/26/2020 04/19/2018 04/15/2017 06/10/2016 04/09/2015  Falls in the past year? 0 No No No No  Number falls in past yr: - - - - -  Injury with Fall? - - - - -  Risk for fall due to : - - - - -  Risk for fall due to: Comment - - - - -   Providers she sees regularly: Eye care provider: Dr Kathlen Mody Ortho: Dr Santo Held specialist: Dr Doy Mince   Depression screen [facility] 2/9 06/26/2020  Decreased Interest 0  Down, Depressed, Hopeless 0  PHQ - 2 Score 0     Mini-Cog - 06/26/20 2022    Normal clock drawing test? yes    How many words correct? 3           Hearing Screening   125Hz  250Hz  500Hz  1000Hz  2000Hz  3000Hz  4000Hz  6000Hz  8000Hz   Right ear:           Left ear:           Vision Screening Comments: Refused. She had eye exam 04/2020.  Last CPE: 04/19/18 She is recovering from finger injury. 06/13/20 crushed right middle finger with garage door, displaced fracture of distal phalange.  Regular exercise 3 or more time per week: Water aerobic and walking (causes knee pain) a few times per week. Following a healthy diet: Yes. She eats chicken and fish. Meat once per month. She loves bread,pasta,and butter.  She lives with her husband.  Chronic medical problems:  HLD,HTN,OA,and HTN.  Immunization History  Administered Date(s) Administered  . Fluad Quad(high Dose 65+) 07/14/2019  . Influenza Split 07/27/2012  . Influenza Whole 08/27/2007, 07/11/2010, 07/24/2011, 07/24/2011  . Influenza, High Dose Seasonal PF 08/09/2015, 07/22/2016, 08/13/2017, 07/18/2018  . Influenza,inj,Quad PF,6+ Mos 07/28/2013, 08/17/2014  . Influenza-Unspecified 07/18/2019  . PFIZER SARS-COV-2 Vaccination 12/09/2019, 12/27/2019  . Pneumococcal Conjugate-13 08/17/2014  . Pneumococcal Polysaccharide-23 07/06/2009  . Td 07/06/2009  . Tdap 06/14/2020  . Zoster 07/11/2010   Mammogram: 08/2012. Colon cancer screening: Cologuard 05/09/18 negative. DEXA: Years ago, normal.   HLD: She is on non pharmacologic treatment. Lab Results  Component Value Date   CHOL 252 (H) 06/20/2020   HDL 50 06/20/2020   LDLCALC 157 (H) 06/20/2020   LDLDIRECT 160.0 03/30/2015   TRIG 274 (H) 06/20/2020   CHOLHDL 5.0 (H) 06/20/2020   HTN: She is on HCTZ 12.5 mg daily.  Lab Results  Component Value Date   CREATININE 0.78 06/20/2020   BUN 12 06/20/2020   NA 140 06/20/2020   K 4.0 06/20/2020   CL 106 06/20/2020   CO2 26 06/20/2020   Knee OA, following with ortho. Topical Voltaren and PT were recommended.  Review of Systems  Constitutional: Negative for appetite change, fatigue and fever.  HENT: Negative for dental problem, hearing loss,  mouth sores and sore throat.   Eyes: Negative for redness and visual disturbance.  Respiratory: Negative for cough, shortness of breath and wheezing.   Cardiovascular: Negative for chest pain and leg swelling.  Gastrointestinal: Negative for abdominal pain, nausea and vomiting.       No changes in bowel habits.  Endocrine: Negative for cold intolerance, heat intolerance, polydipsia, polyphagia and polyuria.  Genitourinary: Negative for decreased urine volume, dysuria and hematuria.  Musculoskeletal: Positive for arthralgias. Negative for gait problem.    Skin: Negative for color change and rash.  Allergic/Immunologic: Negative for environmental allergies.  Neurological: Negative for syncope, weakness and headaches.  Hematological: Negative for adenopathy. Does not bruise/bleed easily.  Psychiatric/Behavioral: Negative for confusion and sleep disturbance. The patient is not nervous/anxious.   All other systems reviewed and are negative.  Current Outpatient Medications on File Prior to Visit  Medication Sig Dispense Refill  . aspirin 81 MG tablet Take 81 mg by mouth daily.    . hydrochlorothiazide (HYDRODIURIL) 12.5 MG tablet Take 1 tablet (12.5 mg total) by mouth daily. 90 tablet 3  . Multiple Vitamin (MULTIVITAMIN WITH MINERALS) TABS tablet Take 1 tablet by mouth daily.     No current facility-administered medications on file prior to visit.   Past Medical History:  Diagnosis Date  . CHEST WALL PAIN, ANTERIOR 07/26/2008  . HYPERLIPIDEMIA 06/01/2008  . HYPERTENSION 07/28/2007  . MENOPAUSAL SYNDROME 06/01/2008    Past Surgical History:  Procedure Laterality Date  . COLONOSCOPY    . NO PAST SURGERIES    . 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 Cancer Mother        lung ca  . Heart disease Father     Social History   Socioeconomic History  . Marital status: Married    Spouse name: Not on file  . Number of children: Not on file  . Years of education: Not on file  . Highest education level: Not on file  Occupational History  . Not on file  Tobacco Use  . Smoking status: Never Smoker  . Smokeless tobacco: Never Used  Substance and Sexual Activity  . Alcohol use: Yes    Comment: rare  . Drug use: No  . Sexual activity: Not on file  Other Topics Concern  . Not on file  Social History Narrative  .  Not on file   Social Determinants of Health   Financial Resource Strain:   . Difficulty of Paying Living Expenses:   Food Insecurity:   . Worried About Charity fundraiser in the Last Year:   . Arboriculturist in the Last Year:   Transportation Needs:   . Film/video editor (Medical):   Marland Kitchen Lack of Transportation (Non-Medical):   Physical Activity:   . Days of Exercise per Week:   . Minutes of Exercise per Session:   Stress:   . Feeling of Stress :   Social Connections:   . Frequency of Communication with Friends and Family:   . Frequency of Social Gatherings with Friends and Family:   . Attends Religious Services:   . Active Member of Clubs or Organizations:   . Attends Archivist Meetings:   Marland Kitchen Marital Status:    Vitals:  06/26/20 0838  BP: 124/70  Pulse: 74  Resp: 16  Temp: (!) 97.4 F (36.3 C)  SpO2: 97%   Body mass index is 24.8 kg/m.  Wt Readings from Last 3 Encounters:  06/26/20 140 lb (63.5 kg)  09/17/18 143 lb (64.9 kg)  05/08/18 142 lb (64.4 kg)   Physical Exam Vitals and nursing note reviewed.  Constitutional:      General: She is not in acute distress.    Appearance: She is well-developed.  HENT:     Head: Normocephalic and atraumatic.     Right Ear: Hearing, tympanic membrane and external ear normal. No tenderness.     Left Ear: Hearing, tympanic membrane and external ear normal. No tenderness.     Ears:     Comments: Scaly/dry skin or ear canals, bilateral.    Mouth/Throat:     Pharynx: Uvula midline.  Eyes:     Conjunctiva/sclera: Conjunctivae normal.     Pupils: Pupils are equal, round, and reactive to light.  Neck:     Trachea: No tracheal deviation.  Cardiovascular:     Rate and Rhythm: Normal rate and regular rhythm.     Pulses:          Dorsalis pedis pulses are 2+ on the right side and 2+ on the left side.     Heart sounds: No murmur heard.   Pulmonary:     Effort: Pulmonary effort is normal. No respiratory distress.       Breath sounds: Normal breath sounds.  Abdominal:     Palpations: Abdomen is soft. There is no hepatomegaly or mass.     Tenderness: There is no abdominal tenderness.  Musculoskeletal:     Comments: No signs of synovitis appreciated. Subunghueal hematoma right middle finger,limitation DIP flexion.  Lymphadenopathy:     Cervical: No cervical adenopathy.     Upper Body:     Right upper body: No supraclavicular adenopathy.     Left upper body: No supraclavicular adenopathy.  Skin:    General: Skin is warm.     Findings: No erythema or rash.  Neurological:     Mental Status: She is alert and oriented to person, place, and time.     Cranial Nerves: No cranial nerve deficit.     Coordination: Coordination normal.     Gait: Gait normal.     Deep Tendon Reflexes:     Reflex Scores:      Bicep reflexes are 2+ on the right side and 2+ on the left side.      Patellar reflexes are 2+ on the right side and 2+ on the left side. Psychiatric:        Mood and Affect: Mood and affect normal.        Speech: Speech normal.     Comments: Well groomed, good eye contact.   ASSESSMENT AND PLAN:  Ms. SHAQUAYLA KLIMAS was here today annual physical examination.  The 10-year ASCVD risk score Mikey Bussing DC Brooke Bonito., et al., 2013) is: 28.7%   Values used to calculate the score:     Age: 64 years     Sex: Female     Is Non-Hispanic African American: No     Diabetic: No     Tobacco smoker: No     Systolic Blood Pressure: 277 mmHg     Is BP treated: Yes     HDL Cholesterol: 50 mg/dL     Total Cholesterol: 252 mg/dL  Diagnoses and all orders for  this visit:  Medicare annual wellness visit, subsequent We discussed the importance of staying active, physically and mentally, as well as the benefits of a healthy/balance diet. Low impact exercise that involve stretching and strengthing are ideal.  We discussed preventive screening for the next 5-10 years, summery of recommendations given in AVS. She is not  interested in further breast cancer screening or DEXA.Fall prevention.  Advance directives and end of life discussed, she has POA and living will.   Routine general medical examination at a health care facility Preventive guidelines reviewed. Vaccination up to date.  Ca++ and vit D supplementation recommended. Next CPE in a year.  Essential hypertension BP adequately controlled. No changes in current management. Eye exam is current.  Hyperlipidemia, mixed We discussed benefits and some side effects of stains. She agrees with trying Crestor low dose, 5 mg daily. We will re-check labs in 4 months.   Return in about 4 months (around 10/26/2020) for HLD.  Briyanna Billingham G. [place], MD  [facility]. [place] office.  Ms. Porath , Thank you for taking time to come for your Medicare Wellness Visit. I appreciate your ongoing commitment to your health goals. Please review the following plan we discussed and let me know if I can assist you in the future.   These are the goals we discussed: Goals   None     This is a list of the screening recommended for you and due dates:  Health Maintenance  Topic Date Due  .  Hepatitis C: One time screening is recommended by Center for Disease Control  (CDC) for  adults born from [birth year] through 1966.   Never done  . DEXA scan (bone density measurement)  Never done  . Flu Shot  09/25/2020*  . Tetanus Vaccine  06/14/2030  . COVID-19 Vaccine  Completed  . Pneumonia vaccines  Completed  *Topic was postponed. The date shown is not the original due date.   If we have ordered labs or studies at this visit, it can take up to 1-2 weeks for results and processing. IF results require follow up or explanation, we will call you with instructions. Clinically stable results will be released to your [facility]. If you have not heard from [place] or cannot find your results in [facility] in 2 weeks please contact our office at [phone].  If you are not yet signed up  for [facility], please consider signing up.  A few tips:  -As we age balance is not as good as it was, so there is a higher risks for falls. Please remove small rugs and furniture that is "in your way" and could increase the risk of falls. Stretching exercises may help with fall prevention: Yoga and Tai Chi are some examples. Low impact exercise is better, so you are not very achy the next day.  -Sun screen and avoidance of direct sun light recommended. Caution with dehydration, if working outdoors be sure to drink enough fluids.  - Some medications are not safe as we age, increases the risk of side effects and can potentially interact with other medication you are also taken;  including some of over the counter medications. Be sure to let me know when you start a new medication even if it is a dietary/vitamin supplement.   -Healthy diet low in red meet/animal fat and sugar + regular physical activity is recommended.  A few things to remember from today's visit:  Hyperlipidemia, mixed  Essential hypertension  Medicare annual wellness visit, subsequent  Routine general medical examination at a health care facility  If you need refills please call your pharmacy. Do not use My Chart to request refills or for acute issues that need immediate attention.   Today we are starting Crestor 5 mg daily for cardiovascular protection and cholesterol. Please be sure medication list is accurate. If a new problem present, please set up appointment sooner than planned today.

## 2020-06-26 NOTE — Assessment & Plan Note (Signed)
BP adequately controlled. No changes in current management. Eye exam is current. 

## 2020-06-26 NOTE — Assessment & Plan Note (Signed)
We discussed benefits and some side effects of stains. She agrees with trying Crestor low dose, 5 mg daily. We will re-check labs in 4 months.

## 2020-07-23 ENCOUNTER — Other Ambulatory Visit: Payer: Self-pay | Admitting: Family Medicine

## 2020-07-23 DIAGNOSIS — I10 Essential (primary) hypertension: Secondary | ICD-10-CM

## 2020-08-01 ENCOUNTER — Other Ambulatory Visit: Payer: Self-pay

## 2020-08-01 ENCOUNTER — Ambulatory Visit (INDEPENDENT_AMBULATORY_CARE_PROVIDER_SITE_OTHER): Payer: Medicare PPO | Admitting: *Deleted

## 2020-08-01 DIAGNOSIS — Z23 Encounter for immunization: Secondary | ICD-10-CM

## 2020-08-19 ENCOUNTER — Encounter: Payer: Self-pay | Admitting: Family Medicine

## 2020-09-05 DIAGNOSIS — M25561 Pain in right knee: Secondary | ICD-10-CM | POA: Diagnosis not present

## 2020-10-11 ENCOUNTER — Other Ambulatory Visit: Payer: Self-pay | Admitting: Family Medicine

## 2020-10-11 DIAGNOSIS — E782 Mixed hyperlipidemia: Secondary | ICD-10-CM

## 2020-10-15 ENCOUNTER — Other Ambulatory Visit: Payer: Self-pay

## 2020-10-15 ENCOUNTER — Encounter: Payer: Self-pay | Admitting: Family Medicine

## 2020-10-15 DIAGNOSIS — E782 Mixed hyperlipidemia: Secondary | ICD-10-CM

## 2020-10-16 ENCOUNTER — Other Ambulatory Visit: Payer: Self-pay

## 2020-10-16 ENCOUNTER — Other Ambulatory Visit: Payer: Medicare PPO

## 2020-10-16 DIAGNOSIS — E782 Mixed hyperlipidemia: Secondary | ICD-10-CM | POA: Diagnosis not present

## 2020-10-17 LAB — LIPID PANEL
Cholesterol: 173 mg/dL (ref <200)
HDL: 55 mg/dL (ref >=50)
LDL Cholesterol (Calc): 95 mg/dL (calc)
Non-HDL Cholesterol (Calc): 118 mg/dL (calc) (ref <130)
Total CHOL/HDL Ratio: 3.1 (calc) (ref <5.0)
Triglycerides: 133 mg/dL (ref <150)

## 2020-10-19 ENCOUNTER — Other Ambulatory Visit: Payer: Self-pay

## 2020-10-19 ENCOUNTER — Encounter: Payer: Self-pay | Admitting: Family Medicine

## 2020-10-19 ENCOUNTER — Ambulatory Visit: Payer: Medicare PPO | Admitting: Family Medicine

## 2020-10-19 VITALS — BP 122/74 | HR 87 | Temp 97.5°F | Resp 16 | Ht 63.0 in | Wt 142.6 lb

## 2020-10-19 DIAGNOSIS — I10 Essential (primary) hypertension: Secondary | ICD-10-CM | POA: Diagnosis not present

## 2020-10-19 DIAGNOSIS — E782 Mixed hyperlipidemia: Secondary | ICD-10-CM | POA: Diagnosis not present

## 2020-10-19 DIAGNOSIS — R5383 Other fatigue: Secondary | ICD-10-CM | POA: Diagnosis not present

## 2020-10-19 DIAGNOSIS — L300 Nummular dermatitis: Secondary | ICD-10-CM

## 2020-10-19 MED ORDER — TRIAMCINOLONE ACETONIDE 0.5 % EX OINT: 1.0000 "application " | TOPICAL_OINTMENT | Freq: Two times a day (BID) | CUTANEOUS | 0 refills | Status: AC

## 2020-10-19 MED ORDER — PRAVASTATIN SODIUM 10 MG PO TABS: 10.0000 mg | ORAL_TABLET | Freq: Every day | ORAL | 1 refills | Status: DC

## 2020-10-19 NOTE — Assessment & Plan Note (Signed)
BP adequately controlled. Continue HCTZ 12.5 mg daily. Low salt diet.

## 2020-10-19 NOTE — Progress Notes (Signed)
HPI: [name] is a 79 y.o. female, who is here today for 3-4 months follow up.   She was last seen on 06/26/20.  She is taking Crestor 5 mg q 2 days. She is exercising regularly.  She has a few questions about statins. Having LE muscle weakness, joint pain,fatigue,"hives" 2 spots, "lack of strain from waist down."  Lab Results  Component Value Date   CHOL 173 10/16/2020   HDL 55 10/16/2020   LDLCALC 95 10/16/2020   LDLDIRECT 160.0 03/30/2015   TRIG 133 10/16/2020   CHOLHDL 3.1 10/16/2020   Pruritic skin lesions on left thigh and right submandibular area. No prior Hx. OTC antimycotic topical treatment did not help.  HTN: She is on HCTZ 12.5 mg daily. Negative for severe/frequent headache, visual changes, chest pain, dyspnea, palpitation focal weakness, or edema.  Lab Results  Component Value Date   CREATININE 0.78 06/20/2020   BUN 12 06/20/2020   NA 140 06/20/2020   K 4.0 06/20/2020   CL 106 06/20/2020   CO2 26 06/20/2020   Knee pain. She follows with ortho. Right intra articular knee injection.  Review of Systems  Constitutional: Positive for fatigue. Negative for activity change, appetite change and fever.  HENT: Negative for mouth sores, nosebleeds and sore throat.   Respiratory: Negative for cough and wheezing.   Cardiovascular: Negative for palpitations.  Gastrointestinal: Negative for abdominal pain, nausea and vomiting.       Negative for changes in bowel habits.  Genitourinary: Negative for decreased urine volume, dysuria and hematuria.  Musculoskeletal: Positive for arthralgias and myalgias.  Neurological: Negative for syncope, facial asymmetry and weakness.  Rest of ROS, see pertinent positives sand negatives in HPI  Current Outpatient Medications on File Prior to Visit  Medication Sig Dispense Refill  . aspirin 81 MG tablet Take 81 mg by mouth daily.    . hydrochlorothiazide (HYDRODIURIL) 12.5 MG tablet TAKE 1 TABLET BY MOUTH EVERY DAY 90  tablet 3  . Multiple Vitamin (MULTIVITAMIN WITH MINERALS) TABS tablet Take 1 tablet by mouth daily.     No current facility-administered medications on file prior to visit.   Past Medical History:  Diagnosis Date  . CHEST WALL PAIN, ANTERIOR 07/26/2008  . HYPERLIPIDEMIA 06/01/2008  . HYPERTENSION 07/28/2007  . MENOPAUSAL SYNDROME 06/01/2008   No Known Allergies  Social History   Socioeconomic History  . Marital status: Married    Spouse name: Not on file  . Number of children: Not on file  . Years of education: Not on file  . Highest education level: Not on file  Occupational History  . Not on file  Tobacco Use  . Smoking status: Never Smoker  . Smokeless tobacco: Never Used  Substance and Sexual Activity  . Alcohol use: Yes    Comment: rare  . Drug use: No  . Sexual activity: Not on file  Other Topics Concern  . Not on file  Social History Narrative  . Not on file   Social Determinants of Health   Financial Resource Strain:   . Difficulty of Paying Living Expenses: Not on file  Food Insecurity:   . Worried About Programme researcher, broadcasting/film/video in the Last Year: Not on file  . Ran Out of Food in the Last Year: Not on file  Transportation Needs:   . Lack of Transportation (Medical): Not on file  . Lack of Transportation (Non-Medical): Not on file  Physical Activity:   . Days of Exercise per  Week: Not on file  . Minutes of Exercise per Session: Not on file  Stress:   . Feeling of Stress : Not on file  Social Connections:   . Frequency of Communication with Friends and Family: Not on file  . Frequency of Social Gatherings with Friends and Family: Not on file  . Attends Religious Services: Not on file  . Active Member of Clubs or Organizations: Not on file  . Attends Banker Meetings: Not on file  . Marital Status: Not on file    Vitals:   10/19/20 0954  BP: 122/74  Pulse: 87  Resp: 16  Temp: (!) 97.5 F (36.4 C)  SpO2: 96%   Body mass index is 25.26  kg/m.  Physical Exam Vitals and nursing note reviewed.  Constitutional:      General: She is not in acute distress.    Appearance: She is well-developed.  HENT:     Head: Normocephalic and atraumatic.      Mouth/Throat:     Mouth: Mucous membranes are moist.     Pharynx: Oropharynx is clear.  Eyes:     Conjunctiva/sclera: Conjunctivae normal.     Pupils: Pupils are equal, round, and reactive to light.  Cardiovascular:     Rate and Rhythm: Normal rate and regular rhythm.     Pulses:          Dorsalis pedis pulses are 2+ on the right side and 2+ on the left side.     Heart sounds: No murmur heard.   Pulmonary:     Effort: Pulmonary effort is normal. No respiratory distress.     Breath sounds: Normal breath sounds.  Abdominal:     Palpations: Abdomen is soft. There is no hepatomegaly or mass.     Tenderness: There is no abdominal tenderness.  Musculoskeletal:       Legs:  Lymphadenopathy:     Cervical: No cervical adenopathy.  Skin:    General: Skin is warm.     Findings: Erythema and rash present.     Comments: Rounded erythematous and mildly scaly lesions on left thigh and right mandibular area.  Neurological:     Mental Status: She is alert and oriented to person, place, and time.     Cranial Nerves: No cranial nerve deficit.     Gait: Gait normal.  Psychiatric:     Comments: Well groomed, good eye contact.   ASSESSMENT AND PLAN:  Ms. Kayla [name] was seen today for 3 months follow-up.  Fatigue, unspecified type It seems related to statin. We discussed other possible etiologies. For now we will hold on further work up.  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 Topical steroid treatment recommended. Some side effects discussed, small amount at the time bid for up to 14 days.  - triamcinolone ointment (KENALOG) 0.5 %; Apply 1 application topically 2 (two) times daily for 14 days.  Dispense: 30 g; Refill: 0  Hyperlipidemia, mixed Great  improvement with Crestor but because side effects, she will discontinue statin. In 4-6 weeks if symptoms resolved she can try Pravastatin 10 mg daily. Low fat diet. We reviewed some side effects of statin meds.  Essential hypertension BP adequately controlled. Continue HCTZ 12.5 mg daily. Low salt diet.  Spent 41 minutes..  During this time history was obtained and documented, examination was performed, prior labs reviewed, and assessment/plan discussed.  Return in about 4 months (around 02/17/2021).   Acelyn Basham G. [place], MD  [facility]. [place] office. A few things to remember from today's visit:   Hyperlipidemia, mixed - Plan: pravastatin (PRAVACHOL) 10 MG tablet  Essential hypertension  Nummular eczema - Plan: triamcinolone ointment (KENALOG) 0.5 %  Stop Crestor and wait for 4-6 weeks or until symptoms resolve. Then you can try Pravastatin 10 mg daily. Skin lesions to be treated with topical osteroid, small amount at the time.  If you need refills please call your pharmacy. Do not use My Chart to request refills or for acute issues that need immediate attention.    Please be sure medication list is accurate. If a new problem present, please set up appointment sooner than planned today.

## 2020-10-19 NOTE — Assessment & Plan Note (Signed)
Great improvement with Crestor but because side effects, she will discontinue statin. In 4-6 weeks if symptoms resolved she can try Pravastatin 10 mg daily. Low fat diet. We reviewed some side effects of statin meds.

## 2020-10-19 NOTE — Patient Instructions (Addendum)
A few things to remember from today's visit:   Hyperlipidemia, mixed - Plan: pravastatin (PRAVACHOL) 10 MG tablet  Essential hypertension  Nummular eczema - Plan: triamcinolone ointment (KENALOG) 0.5 %  Stop Crestor and wait for 4-6 weeks or until symptoms resolve. Then you can try Pravastatin 10 mg daily. Skin lesions to be treated with topical osteroid, small amount at the time.  If you need refills please call your pharmacy. Do not use My Chart to request refills or for acute issues that need immediate attention.    Please be sure medication list is accurate. If a new problem present, please set up appointment sooner than planned today.

## 2020-10-23 ENCOUNTER — Other Ambulatory Visit: Payer: Self-pay

## 2020-10-23 DIAGNOSIS — E782 Mixed hyperlipidemia: Secondary | ICD-10-CM

## 2020-10-23 MED ORDER — PRAVASTATIN SODIUM 10 MG PO TABS: 10.0000 mg | ORAL_TABLET | Freq: Every day | ORAL | 1 refills | Status: DC

## 2020-10-26 ENCOUNTER — Ambulatory Visit: Payer: Medicare PPO | Admitting: Family Medicine

## 2020-11-19 ENCOUNTER — Encounter: Payer: Self-pay | Admitting: Family Medicine

## 2020-11-19 ENCOUNTER — Telehealth: Payer: Self-pay | Admitting: Family Medicine

## 2020-11-19 NOTE — Telephone Encounter (Signed)
The patient would like an EKG before she goes out of town this week. I advised the patient that Dr. [place] is out of the office til Wednesday this week and I told her that I believe she has to get the EKG ordered before she can have the EKG done.   The patient would like a called beck today to schedule.

## 2020-11-22 NOTE — Telephone Encounter (Signed)
Nurse visit for EKG can be arranged. Indication? Is she having CP? Thanks, BJ

## 2020-11-23 NOTE — Telephone Encounter (Signed)
Left a message for the pt to return a call to the office.   

## 2021-01-12 ENCOUNTER — Encounter: Payer: Self-pay | Admitting: Family Medicine

## 2021-01-14 ENCOUNTER — Encounter: Payer: Self-pay | Admitting: Family Medicine

## 2021-01-21 MED ORDER — TRIAMCINOLONE ACETONIDE 0.5 % EX OINT: 1.0000 "application " | TOPICAL_OINTMENT | Freq: Two times a day (BID) | CUTANEOUS | 0 refills | Status: DC

## 2021-03-04 ENCOUNTER — Encounter: Payer: Self-pay | Admitting: Family Medicine

## 2021-03-04 ENCOUNTER — Other Ambulatory Visit: Payer: Self-pay

## 2021-03-04 ENCOUNTER — Ambulatory Visit: Payer: Medicare PPO | Admitting: Family Medicine

## 2021-03-04 VITALS — BP 118/78 | HR 78 | Temp 97.9°F | Resp 16 | Ht 63.0 in | Wt 144.5 lb

## 2021-03-04 DIAGNOSIS — L298 Other pruritus: Secondary | ICD-10-CM | POA: Diagnosis not present

## 2021-03-04 DIAGNOSIS — M79605 Pain in left leg: Secondary | ICD-10-CM

## 2021-03-04 DIAGNOSIS — M79604 Pain in right leg: Secondary | ICD-10-CM

## 2021-03-04 MED ORDER — PREDNISONE 20 MG PO TABS: ORAL_TABLET | ORAL | 0 refills | Status: AC

## 2021-03-04 NOTE — Patient Instructions (Addendum)
A few things to remember from today's visit:   Pruritic erythematous rash - Plan: CBC, Basic metabolic panel  Pain in both lower extremities - Plan: CBC, Basic metabolic panel, CK  Take Prednisone with breakfast. Keep appt with dermatologist. If labs are normal you can see them on my chart.If any abnormality we will contact you.  Please be sure medication list is accurate. If a new problem present, please set up appointment sooner than planned today.

## 2021-03-04 NOTE — Progress Notes (Addendum)
Chief Complaint  Patient presents with  . rash & sore on feet   HPI: Kayla [name] is a 80 y.o. female, who is here today complaining of very pruritic rash that started about 6 months ago. She describes rash "big spots" of dry skin. Initially on right temple then upper lips,lower back,upper and lower extremities. Dorsum or feel and palms are also affected. No interdigital,abdomen,or chest lesions. Some lesions have healed. Pruritus is worse at night and interferes with sleep.  Negative for any new medication, detergent, soap, or body product. No known insect bite or outdoor exposures to plants. No sick contact. No Hx of eczema or similar rash in the past.  OTC medication for this problem: Cortisone. She also tried prescription triamcinolone, which helps while using it daily. She has also tried soaking toes in Epson salt with water and she noted some relief. She has a dermatologist appointment in 03/2021.  Negative for fever, unusual fatigue, oral lesions/edema,cough, wheezing, dyspnea, abdominal pain, nausea, or vomiting. She also complains of lower extremity muscle aches. Problem has been going on for "a while." Stable. Negative for associated back pain,weakness,cyanosis,edema,erythema, numbness, or tingling.  Review of Systems  Constitutional: Negative for activity change and appetite change.  HENT: Negative for mouth sores, nosebleeds, sore throat and trouble swallowing.   Eyes: Negative for discharge, redness and itching.  Cardiovascular: Negative for chest pain and palpitations.  Gastrointestinal:       Negative for changes in bowel habits.  Genitourinary: Negative for decreased urine volume and hematuria.  Musculoskeletal: Positive for myalgias. Negative for gait problem.  Allergic/Immunologic: Negative for environmental allergies.  Neurological: Negative for syncope and headaches.  Rest see pertinent positives and negatives per HPI.  Current Outpatient  Medications on File Prior to Visit  Medication Sig Dispense Refill  . aspirin 81 MG tablet Take 81 mg by mouth daily.    . hydrochlorothiazide (HYDRODIURIL) 12.5 MG tablet TAKE 1 TABLET BY MOUTH EVERY DAY 90 tablet 3  . Multiple Vitamin (MULTIVITAMIN WITH MINERALS) TABS tablet Take 1 tablet by mouth daily.    . pravastatin (PRAVACHOL) 10 MG tablet Take 1 tablet (10 mg total) by mouth daily. 90 tablet 1  . triamcinolone ointment (KENALOG) 0.5 % Apply 1 application topically 2 (two) times daily. 30 g 0   No current facility-administered medications on file prior to visit.   Past Medical History:  Diagnosis Date  . CHEST WALL PAIN, ANTERIOR 07/26/2008  . HYPERLIPIDEMIA 06/01/2008  . HYPERTENSION 07/28/2007  . MENOPAUSAL SYNDROME 06/01/2008   No Known Allergies  Social History   Socioeconomic History  . Marital status: Married    Spouse name: Not on file  . Number of children: Not on file  . Years of education: Not on file  . Highest education level: Not on file  Occupational History  . Not on file  Tobacco Use  . Smoking status: Never Smoker  . Smokeless tobacco: Never Used  Substance and Sexual Activity  . Alcohol use: Yes    Comment: rare  . Drug use: No  . Sexual activity: Not on file  Other Topics Concern  . Not on file  Social History Narrative  . Not on file   Social Determinants of Health   Financial Resource Strain: Not on file  Food Insecurity: Not on file  Transportation Needs: Not on file  Physical Activity: Not on file  Stress: Not on file  Social Connections: Not on file   Vitals:   03/04/21  1546  BP: 118/78  Pulse: 78  Resp: 16  Temp: 97.9 F (36.6 C)  SpO2: 96%   Body mass index is 25.6 kg/m.  Physical Exam Vitals and nursing note reviewed.  Constitutional:      General: She is not in acute distress.    Appearance: She is well-developed.  HENT:     Head: Normocephalic and atraumatic.     Mouth/Throat:     Mouth: Mucous membranes are moist.      Pharynx: Oropharynx is clear.  Eyes:     Conjunctiva/sclera: Conjunctivae normal.  Cardiovascular:     Rate and Rhythm: Normal rate and regular rhythm.     Pulses:          Dorsalis pedis pulses are 2+ on the right side and 2+ on the left side.     Heart sounds: No murmur heard.     Comments: Varicose veins LE,bilateral Pulmonary:     Effort: Pulmonary effort is normal. No respiratory distress.     Breath sounds: Normal breath sounds.  Abdominal:     Palpations: Abdomen is soft. There is no hepatomegaly or mass.     Tenderness: There is no abdominal tenderness.  Musculoskeletal:       Hands:     Right upper leg: No tenderness or bony tenderness.     Left upper leg: No tenderness or bony tenderness.     Right lower leg: No tenderness or bony tenderness. No edema.     Left lower leg: No tenderness or bony tenderness. No edema.       Legs:  Lymphadenopathy:     Cervical: No cervical adenopathy.  Skin:    General: Skin is warm.     Findings: Erythema and rash present. Rash is scaling. Rash is not pustular, urticarial or vesicular.          Comments: Patchy scaly and mildly erythematous rash scattered on upper and lower extremities. Also lesions on lower back. No edema or tenderness.  Neurological:     Mental Status: She is alert and oriented to person, place, and time.     Cranial Nerves: No cranial nerve deficit.     Gait: Gait normal.  Psychiatric:     Comments: Well groomed, good eye contact.   ASSESSMENT AND PLAN:  Ms.Arial was seen today for rash & sore on feet.  Diagnoses and all orders for this visit: Orders Placed This Encounter  Procedures  . CBC  . Basic metabolic panel  . CK   Lab Results  Component Value Date   CREATININE 0.86 03/04/2021   BUN 18 03/04/2021   NA 138 03/04/2021   K 4.3 03/04/2021   CL 104 03/04/2021   CO2 28 03/04/2021   Lab Results  Component Value Date   WBC 7.3 03/04/2021   HGB 14.6 03/04/2021   HCT 43.8 03/04/2021    MCV 92.8 03/04/2021   PLT 285.0 03/04/2021   Lab Results  Component Value Date   CKTOTAL 52 03/04/2021   Pruritic erythematous rash Eczema and psoriasis to be considered. After discussion of some side effects, she agrees with trying trial of prednisone.  Recommend taking medication with breakfast. Instructed to keep appt with her dermatologist. Zyrtec 10 mg daily. Continue topical Triamcinolone on affected lesions bid prn.  -     predniSONE (DELTASONE) 20 MG tablet; 2 tabs for 5 days, 1 tabs for 3 days, 1/2 for 3 days. Take tables together with breakfast.  Pain in both lower  extremities We discussed possible etiologies. It doe snot seem related with skin rash. ? Vein disease. Further recommendations according to lab results.  Return if symptoms worsen or fail to improve.   Prisca Gearing G. [place], MD  [facility]. [place] office.   A few things to remember from today's visit:   Pruritic erythematous rash - Plan: CBC, Basic metabolic panel  Pain in both lower extremities - Plan: CBC, Basic metabolic panel, CK  Take Prednisone with breakfast. Keep appt with dermatologist. If labs are normal you can see them on my chart.If any abnormality we will contact you.  Please be sure medication list is accurate. If a new problem present, please set up appointment sooner than planned today.

## 2021-03-05 LAB — CBC
HCT: 43.8 % (ref 36.0–46.0)
Hemoglobin: 14.6 g/dL (ref 12.0–15.0)
MCHC: 33.3 g/dL (ref 30.0–36.0)
MCV: 92.8 fl (ref 78.0–100.0)
Platelets: 285 10*3/uL (ref 150.0–400.0)
RBC: 4.72 Mil/uL (ref 3.87–5.11)
RDW: 13.6 % (ref 11.5–15.5)
WBC: 7.3 10*3/uL (ref 4.0–10.5)

## 2021-03-05 LAB — BASIC METABOLIC PANEL
BUN: 18 mg/dL (ref 6–23)
CO2: 28 mEq/L (ref 19–32)
Calcium: 10.5 mg/dL (ref 8.4–10.5)
Chloride: 104 mEq/L (ref 96–112)
Creatinine, Ser: 0.86 mg/dL (ref 0.40–1.20)
GFR: 63.69 mL/min (ref >60.00)
Glucose, Bld: 86 mg/dL (ref 70–99)
Potassium: 4.3 mEq/L (ref 3.5–5.1)
Sodium: 138 mEq/L (ref 135–145)

## 2021-03-05 LAB — CK: Total CK: 52 U/L (ref 7–177)

## 2021-03-11 DIAGNOSIS — H35372 Puckering of macula, left eye: Secondary | ICD-10-CM | POA: Diagnosis not present

## 2021-03-11 DIAGNOSIS — H43813 Vitreous degeneration, bilateral: Secondary | ICD-10-CM | POA: Diagnosis not present

## 2021-03-11 DIAGNOSIS — Z961 Presence of intraocular lens: Secondary | ICD-10-CM | POA: Diagnosis not present

## 2021-03-25 DIAGNOSIS — D1801 Hemangioma of skin and subcutaneous tissue: Secondary | ICD-10-CM | POA: Diagnosis not present

## 2021-03-25 DIAGNOSIS — R21 Rash and other nonspecific skin eruption: Secondary | ICD-10-CM | POA: Diagnosis not present

## 2021-03-25 DIAGNOSIS — D229 Melanocytic nevi, unspecified: Secondary | ICD-10-CM | POA: Diagnosis not present

## 2021-03-25 DIAGNOSIS — L821 Other seborrheic keratosis: Secondary | ICD-10-CM | POA: Diagnosis not present

## 2021-03-25 DIAGNOSIS — L814 Other melanin hyperpigmentation: Secondary | ICD-10-CM | POA: Diagnosis not present

## 2021-06-12 ENCOUNTER — Telehealth: Payer: Self-pay | Admitting: Family Medicine

## 2021-06-12 NOTE — Telephone Encounter (Signed)
error 

## 2021-07-15 ENCOUNTER — Other Ambulatory Visit: Payer: Self-pay

## 2021-07-15 ENCOUNTER — Ambulatory Visit (INDEPENDENT_AMBULATORY_CARE_PROVIDER_SITE_OTHER): Payer: Medicare PPO | Admitting: Family Medicine

## 2021-07-15 ENCOUNTER — Encounter: Payer: Self-pay | Admitting: Family Medicine

## 2021-07-15 VITALS — BP 138/80 | HR 88 | Resp 16 | Ht 63.0 in | Wt 140.0 lb

## 2021-07-15 DIAGNOSIS — M159 Polyosteoarthritis, unspecified: Secondary | ICD-10-CM

## 2021-07-15 DIAGNOSIS — B351 Tinea unguium: Secondary | ICD-10-CM

## 2021-07-15 DIAGNOSIS — L821 Other seborrheic keratosis: Secondary | ICD-10-CM

## 2021-07-15 DIAGNOSIS — M79604 Pain in right leg: Secondary | ICD-10-CM

## 2021-07-15 DIAGNOSIS — L301 Dyshidrosis [pompholyx]: Secondary | ICD-10-CM | POA: Diagnosis not present

## 2021-07-15 DIAGNOSIS — L853 Xerosis cutis: Secondary | ICD-10-CM

## 2021-07-15 DIAGNOSIS — E782 Mixed hyperlipidemia: Secondary | ICD-10-CM

## 2021-07-15 DIAGNOSIS — Z0000 Encounter for general adult medical examination without abnormal findings: Secondary | ICD-10-CM | POA: Diagnosis not present

## 2021-07-15 DIAGNOSIS — I10 Essential (primary) hypertension: Secondary | ICD-10-CM | POA: Diagnosis not present

## 2021-07-15 MED ORDER — HYDROCHLOROTHIAZIDE 12.5 MG PO TABS: 12.5000 mg | ORAL_TABLET | Freq: Every day | ORAL | 3 refills | Status: DC

## 2021-07-15 MED ORDER — SHINGRIX 50 MCG/0.5ML IM SUSR: INTRAMUSCULAR | 1 refills | Status: DC

## 2021-07-15 NOTE — Patient Instructions (Addendum)
  Kayla [name] , Thank you for taking time to come for your Medicare Wellness Visit. I appreciate your ongoing commitment to your health goals. Please review the following plan we discussed and let me know if I can assist you in the future.   These are the goals we discussed:  Goals   None     This is a list of the screening recommended for you and due dates:  Health Maintenance  Topic Date Due   Flu Shot  06/17/2021   COVID-19 Vaccine (3 - Booster for Pfizer series) 07/31/2021*   Zoster (Shingles) Vaccine (1 of 2) 11/22/2021*   Tetanus Vaccine  06/14/2030   Pneumonia vaccines  Completed   HPV Vaccine  Aged Out   DEXA scan (bone density measurement)  Discontinued  *Topic was postponed. The date shown is not the original due date.   A few tips:  -As we age balance is not as good as it was, so there is a higher risks for falls. Please remove small rugs and furniture that is "in your way" and could increase the risk of falls. Stretching exercises may help with fall prevention: Yoga and Tai Chi are some examples. Low impact exercise is better, so you are not very achy the next day.  -Sun screen and avoidance of direct sun light recommended. Caution with dehydration, if working outdoors be sure to drink enough fluids.  - Some medications are not safe as we age, increases the risk of side effects and can potentially interact with other medication you are also taken;  including some of over the counter medications. Be sure to let me know when you start a new medication even if it is a dietary/vitamin supplement.   -Healthy diet low in red meet/animal fat and sugar + regular physical activity is recommended.    A few things to remember from today's visit:  Medicare annual wellness visit, subsequent  Essential hypertension - Plan: hydrochlorothiazide (HYDRODIURIL) 12.5 MG tablet  Hyperlipidemia, mixed  Seborrheic keratosis  Eczema, dyshidrotic  Dry skin  Generalized osteoarthritis  of multiple sites  Keep next appt with dermatologist. We will plan on fasting in 02/2022.  Trim toenails straight, soak feet in warm water with vineger for 10-15 min them move cuticle away from toe nail. Vic Vapor rub on toenail.  If you need refills please call your pharmacy. Do not use My Chart to request refills or for acute issues that need immediate attention.   Please be sure medication list is accurate. If a new problem present, please set up appointment sooner than planned today.

## 2021-07-15 NOTE — Progress Notes (Signed)
HPI: [name] is a 80 y.o. female, who is here today for her AWV and follow up. Last AWV on 06/26/20.  Since her last visit, she has seen dermatologist and has another a f/u visit in 08/2021.  She lives with her husband.. Independent ADL's and IADL's.  She walks almost daily and planning on pool exercises, which she did before COVID pandemia. She is also active around her house ,gardening, and with general chores.  She follows a healthful diet, she cooks at home. Fish and vegetables. She does not eat much fruits.  Enjoys painting. She spends 6 months in [place], from October to April.  Functional Status Survey: Is the patient deaf or have difficulty hearing?: No Does the patient have difficulty seeing, even when wearing glasses/contacts?: No Does the patient have difficulty concentrating, remembering, or making decisions?: No Does the patient have difficulty walking or climbing stairs?: No Does the patient have difficulty dressing or bathing?: No Does the patient have difficulty doing errands alone such as visiting a doctor's office or shopping?: No  Fall Risk  07/15/2021 06/26/2020 04/19/2018 04/15/2017 06/10/2016  Falls in the past year? 0 0 No No No  Number falls in past yr: 0 - - - -  Injury with Fall? 0 - - - -  Risk for fall due to : - - - - -  Risk for fall due to: Comment - - - - -  Follow up Education provided - - - -   Providers she sees regularly: Dr Eulah Pont, ortho, prn. Dr Alben Spittle, ophthalmologist, annually. Dermatologist, next appt in 08/2021.  Depression screen [facility] 2/9 07/15/2021  Decreased Interest 0  Down, Depressed, Hopeless 0  PHQ - 2 Score 0   Vision Screening - Comments:: Sees eye doctor yearly, just had exam.   Immunization History  Administered Date(s) Administered   Fluad Quad(high Dose 65+) 07/14/2019, 08/01/2020   Influenza Split 07/27/2012   Influenza Whole 08/27/2007, 07/11/2010, 07/24/2011, 07/24/2011   Influenza, High Dose Seasonal  PF 08/09/2015, 07/22/2016, 08/13/2017, 07/18/2018   Influenza,inj,Quad PF,6+ Mos 07/28/2013, 08/17/2014   Influenza-Unspecified 07/18/2019   PFIZER(Purple Top)SARS-COV-2 Vaccination 12/09/2019, 12/27/2019   Pneumococcal Conjugate-13 08/17/2014   Pneumococcal Polysaccharide-23 07/06/2009   Td 07/06/2009   Tdap 06/14/2020   Zoster, Live 07/11/2010   Mammogram: 2013, she is not interested in further breast cancer screening. DEXA: Years ago. She takes a daily multivitamin.  Follow up and concerns today: HTN on HCTZ 12.5 mg daily. Negative for severe/frequent headache, visual changes, chest pain, dyspnea, palpitation, claudication, focal weakness, or edema.  Lab Results  Component Value Date   CREATININE 0.86 03/04/2021   BUN 18 03/04/2021   NA 138 03/04/2021   K 4.3 03/04/2021   CL 104 03/04/2021   CO2 28 03/04/2021   HLD: She is on non pharmacologic treatment. Lab Results  Component Value Date   CHOL 173 10/16/2020   HDL 55 10/16/2020   LDLCALC 95 10/16/2020   LDLDIRECT 160.0 03/30/2015   TRIG 133 10/16/2020   CHOLHDL 3.1 10/16/2020   Soreness around some toes, mainly right great toe. Problem has been going on for a while,since 02/2021, "reddish" constant but no pruritus.She has not noted periungual edema or erythema. No hx of trauma.  Fatigue: Problem has been going on for a while. She does not have the energy she used to. Takes nap in the afternoon, up to 60 min and feels better. Sleeps well, 8 at least.  Lab Results  Component Value Date  TSH 1.80 06/20/2020   Lab Results  Component Value Date   WBC 7.3 03/04/2021   HGB 14.6 03/04/2021   HCT 43.8 03/04/2021   MCV 92.8 03/04/2021   PLT 285.0 03/04/2021   Dry skin and peeling edge of upper lip, no pruritus or pain. Problem has been going on for a few months.   A few months ago noted small raised lesion on right forearm. It is not tender or pruritic.  RLE dull pain, intermittent for years exacerbated by  prolonged standing. Alleviated by rest. It does not interfere with sleep. Occasionally LLE pain. She has not noted numbness,tingling,or burning. No saddle anesthesia or bladder/bowel dysfunction. Negative for edema or erythema.  Back pain, intermittent for years, stable,and not radiated. Generalized arthralgias, no edema or erythema. Knee pain improved with intra articular injection. Follows with ortho as needed.  Pruritic lesions on hands and feet, around fingers and toes. Pruritic rash on lower and upper extremities. Her dermatologist prescribed Triamcinolone cream, which has helped but having new lesions appearing. She has not identified exacerbating factors.  Review of Systems  Constitutional:  Negative for activity change, appetite change and fever.  HENT:  Negative for mouth sores, nosebleeds and sore throat.   Respiratory:  Negative for cough and wheezing.   Gastrointestinal:  Negative for abdominal pain, nausea and vomiting.       Negative for changes in bowel habits.  Endocrine: Negative for cold intolerance and heat intolerance.  Genitourinary:  Negative for decreased urine volume, dysuria and hematuria.  Musculoskeletal:  Positive for arthralgias and back pain. Negative for gait problem and myalgias.  Skin:  Positive for rash. Negative for pallor.  Neurological:  Negative for syncope, facial asymmetry and weakness.  Psychiatric/Behavioral:  Negative for sleep disturbance.    Current Outpatient Medications on File Prior to Visit  Medication Sig Dispense Refill   Multiple Vitamin (MULTIVITAMIN WITH MINERALS) TABS tablet Take 1 tablet by mouth daily.     No current facility-administered medications on file prior to visit.   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/2014   Procedure: RIGHT TOTAL HIP ARTHROPLASTY ANTERIOR APPROACH;  Surgeon: Sheral Apley, MD;  Location: [facility] OR;  Service: Orthopedics;  Laterality: Right;   No Known Allergies  Family History  Problem Relation Age of Onset   Cancer Mother        lung ca   Heart disease Father    Social History   Socioeconomic History   Marital status: Married    Spouse name: Not on file   Number of children: Not on file   Years of education: Not on file   Highest education level: Not on file  Occupational History   Not on file  Tobacco Use   Smoking status: Never   Smokeless tobacco: Never  Substance and Sexual Activity   Alcohol use: Yes    Comment: rare   Drug use: No   Sexual activity: Not on file  Other Topics Concern   Not on file  Social History Narrative   Not on file   Social Determinants of Health   Financial Resource Strain: Not on file  Food Insecurity: Not on file  Transportation Needs: Not on file  Physical  Activity: Not on file  Stress: Not on file  Social Connections: Not on file   Vitals:   07/15/21 0844  BP: 138/80  Pulse: 88  Resp: 16  SpO2: 98%   Body mass index is 24.8 kg/m.  Wt Readings from Last 3 Encounters:  07/15/21 140 lb (63.5 kg)  03/04/21 144 lb 8 oz (65.5 kg)  10/19/20 142 lb 9.6 oz (64.7 kg)   Physical Exam Vitals and nursing note reviewed.  Constitutional:      General: She is not in acute distress.    Appearance: She is well-developed.  HENT:     Head: Normocephalic and atraumatic.     Mouth/Throat:     Mouth: Mucous membranes are moist.     Pharynx: Oropharynx is clear.  Eyes:     Conjunctiva/sclera: Conjunctivae normal.  Cardiovascular:     Rate and Rhythm: Normal rate and regular rhythm.     Pulses:          Dorsalis pedis pulses are 2+ on the right side and 2+ on the left side.     Heart sounds: No murmur heard.    Comments: Varicose veins LE,bilateral. Pulmonary:      Effort: Pulmonary effort is normal. No respiratory distress.     Breath sounds: Normal breath sounds.  Abdominal:     Palpations: Abdomen is soft. There is no hepatomegaly or mass.     Tenderness: There is no abdominal tenderness.  Musculoskeletal:     Lumbar back: No tenderness or bony tenderness. Negative right straight leg raise test and negative left straight leg raise test.     Right foot: Deformity (Mild/toes) and bunion present.     Left foot: Bunion present.     Comments: No signs of synovitis.  Lymphadenopathy:     Cervical: No cervical adenopathy.  Skin:    General: Skin is warm.     Findings: No erythema.     Comments: Right foot: Great toenail with center detached from nail bed and debris underneath. No periungual edema or erythema. Mild tenderness with palpation of periungual corners.  A few toes with calluses.  Scattered on forearms hyperpigmented macular lesions and others mildly raised,oval,and rough surface. No suspicious lesions appreciated.  Some areas of finely scaly skin on LLE and upper lip.  Neurological:     General: No focal deficit present.     Mental Status: She is alert and oriented to person, place, and time.     Cranial Nerves: No cranial nerve deficit.     Gait: Gait normal.     Deep Tendon Reflexes:     Reflex Scores:      Patellar reflexes are 2+ on the right side and 2+ on the left side. Psychiatric:     Comments: Well groomed, good eye contact.   ASSESSMENT AND PLAN:  Ms. Kayla [name] was here today annual physical examination.  Diagnoses and all orders for this visit:  Medicare annual wellness visit, subsequent We discussed the importance of staying active, physically and mentally, as well as the benefits of a healthy/balance diet. Low impact exercise that involve stretching and strengthing are ideal. Vaccines: Rx for shingrix given. We discussed preventive screening for the next 5-10 years, summery of recommendations given in  AVS.  This is a list of the screening recommended for you and due dates:  Health Maintenance  Topic Date Due   COVID-19 Vaccine (3 - Pfizer risk series) 07/31/2021*   Flu Shot  08/29/2021*  Zoster (Shingles) Vaccine (2 of 2) 09/10/2021   Tetanus Vaccine  06/14/2030   Pneumonia vaccines  Completed   HPV Vaccine  Aged Out   DEXA scan (bone density measurement)  Discontinued  *Topic was postponed. The date shown is not the original due date.   Fall prevention. She is not interested in continuing breast cancer screening or having DEXA for now. Advance directives and end of life discussed, she has POA and living will.  Pain of right lower extremity Chronic. We discussed possible causes. ? Radicular pain, vein disease,and OA among some.  Hyperlipidemia, mixed Continue non pharmacologic treatment. She is not fasting today, so we will plan on FLP next visit.  Essential hypertension BP adequately controlled. Monitor BP regularly. Continue HCTZ 12.5 mg daily. Eye exam is current.  -     hydrochlorothiazide (HYDRODIURIL) 12.5 MG tablet; Take 1 tablet (12.5 mg total) by mouth daily.  Seborrheic keratosis Reassured. Educated about Dx and treatment options.  Eczema, dyshidrotic No active lesions. Continue topical triamcinolone cream, she can apply it at night and put on cotton gloves and soaks. Following with dermatologist.  Generalized osteoarthritis of multiple sites Stable. Can be contributing to LE's pain. Tylenol 500 mg 3-4 times per day if needed. Low impact exercise.  Onychomycosis Right great toe. Vick Vapor rub at bedtime may help.  Soaking feet in warm water for 10-15 min and then moving cuticle away from toenail may help with periungual soreness. Keep toenails trimmed down. She can try gel toe spacer to prevent irritation from overlapping toes.  Other orders -     Zoster Vaccine Adjuvanted [facility](SHINGRIX) injection; 0.5 ml in muscle and repeat in 8 weeks  Return in  about 8 months (around 03/14/2022) for cpe.  Amarilis Belflower G. [place], MD  [facility]. [place] office.

## 2021-07-21 ENCOUNTER — Ambulatory Visit (INDEPENDENT_AMBULATORY_CARE_PROVIDER_SITE_OTHER): Payer: Medicare PPO

## 2021-07-21 ENCOUNTER — Ambulatory Visit (HOSPITAL_COMMUNITY)
Admission: EM | Admit: 2021-07-21 | Discharge: 2021-07-21 | Disposition: A | Discharge status: Home / Self Care | Payer: Medicare PPO | Attending: Emergency Medicine | Admitting: Emergency Medicine

## 2021-07-21 ENCOUNTER — Encounter (HOSPITAL_COMMUNITY): Payer: Self-pay | Admitting: *Deleted

## 2021-07-21 DIAGNOSIS — S20211A Contusion of right front wall of thorax, initial encounter: Secondary | ICD-10-CM

## 2021-07-21 DIAGNOSIS — R0781 Pleurodynia: Secondary | ICD-10-CM | POA: Diagnosis not present

## 2021-07-21 DIAGNOSIS — W19XXXA Unspecified fall, initial encounter: Secondary | ICD-10-CM

## 2021-07-21 DIAGNOSIS — S20212A Contusion of left front wall of thorax, initial encounter: Secondary | ICD-10-CM

## 2021-07-21 DIAGNOSIS — M546 Pain in thoracic spine: Secondary | ICD-10-CM

## 2021-07-21 DIAGNOSIS — W101XXA Fall (on)(from) sidewalk curb, initial encounter: Secondary | ICD-10-CM | POA: Diagnosis not present

## 2021-07-21 DIAGNOSIS — R52 Pain, unspecified: Secondary | ICD-10-CM

## 2021-07-21 NOTE — ED Provider Notes (Signed)
[facility]    CSN: [id number] Arrival date & time: 07/21/21  1402      History   Chief Complaint Chief Complaint  Patient presents with   Fall    HPI Kayla [name] is a 80 y.o. female.   Patient states she was walking over the weekend and tripped on an uneven edge of sidewalk and fell forward on her left outstretched arm hitting her left ribs and left side.  Patient states that since then she has been very sore in both ribs.  She adds that she scraped her hands and bumped her right knee as well but these are doing fine.  Patient states taking deep breath increases pain but denies feeling short of breath.  The history is provided by the patient and the spouse.   Past Medical History:  Diagnosis Date   CHEST WALL PAIN, ANTERIOR 07/26/2008   HYPERLIPIDEMIA 06/01/2008   HYPERTENSION 07/28/2007   MENOPAUSAL SYNDROME 06/01/2008    Patient Active Problem List   Diagnosis Date Noted   Generalized osteoarthritis of multiple sites 07/15/2021   DJD (degenerative joint disease) 07/04/2014   CHEST WALL PAIN, ANTERIOR 07/26/2008   Hyperlipidemia, mixed 06/01/2008   MENOPAUSAL SYNDROME 06/01/2008   Essential hypertension 07/28/2007    Past Surgical History:  Procedure Laterality Date   COLONOSCOPY     NO PAST SURGERIES     TOTAL HIP ARTHROPLASTY Right 07/04/2014   Procedure: RIGHT TOTAL HIP ARTHROPLASTY ANTERIOR APPROACH;  Surgeon: Sheral Apley, MD;  Location: [facility];  Service: Orthopedics;  Laterality: Right;    OB History   No obstetric history on file.      Home Medications    Prior to Admission medications   Medication Sig Start Date End Date Taking? Authorizing Provider  hydrochlorothiazide (HYDRODIURIL) 12.5 MG tablet Take 1 tablet (12.5 mg total) by mouth daily. 07/15/21   [place], Betty G, MD  Multiple Vitamin (MULTIVITAMIN WITH MINERALS) TABS tablet Take 1 tablet by mouth daily.    [provider]  Zoster Vaccine Adjuvanted [facility]) injection  0.5 ml in muscle and repeat in 8 weeks 07/15/21   [place], Betty G, MD    Family History Family History  Problem Relation Age of Onset   Cancer Mother        lung ca   Heart disease Father     Social History Social History   Tobacco Use   Smoking status: Never   Smokeless tobacco: Never  Substance Use Topics   Alcohol use: Yes    Comment: rare   Drug use: No     Allergies   Patient has no known allergies.   Review of Systems Review of Systems  All other systems reviewed and are negative.   Physical Exam Triage Vital Signs ED Triage Vitals  Enc Vitals Group     BP 07/21/21 1606 (!) 167/94     Pulse Rate 07/21/21 1606 77     Resp 07/21/21 1606 20     Temp 07/21/21 1606 98.1 F (36.7 C)     Temp src --      SpO2 07/21/21 1606 98 %     Weight --      Height --      Head Circumference --      Peak Flow --      Pain Score 07/21/21 1607 8     Pain Loc --      Pain Edu? --      Excl. in  GC? --    No data found.  Updated Vital Signs BP (!) 167/94   Pulse 77   Temp 98.1 F (36.7 C)   Resp 20   SpO2 98%   Visual Acuity Right Eye Distance:   Left Eye Distance:   Bilateral Distance:    Right Eye Near:   Left Eye Near:    Bilateral Near:     Physical Exam Constitutional:      Appearance: Normal appearance.  HENT:     Head: Normocephalic and atraumatic.  Cardiovascular:     Rate and Rhythm: Normal rate and regular rhythm.     Heart sounds: Normal heart sounds.  Pulmonary:     Effort: Pulmonary effort is normal. No respiratory distress.     Breath sounds: Normal breath sounds. No stridor. No wheezing, rhonchi or rales.  Chest:     Chest wall: No tenderness.  Abdominal:     General: Abdomen is flat.     Palpations: Abdomen is soft.  Musculoskeletal:     Comments: Significant tenderness to palpation at lateral aspect of T12 on the left and anterior right right.  Skin:    General: Skin is warm and dry.  Neurological:     General: No focal  deficit present.     Mental Status: She is alert and oriented to person, place, and time. Mental status is at baseline.  Psychiatric:        Mood and Affect: Mood normal.        Behavior: Behavior normal.        Thought Content: Thought content normal.        Judgment: Judgment normal.     UC Treatments / Results  Labs (all labs ordered are listed, but only abnormal results are displayed) Labs Reviewed - No data to display  EKG   Radiology DG Ribs Unilateral Left  Result Date: 07/21/2021 CLINICAL DATA:  Trip and fall with bilateral rib pain. EXAM: RIGHT RIBS AND CHEST - 3+ VIEW; LEFT RIBS - 2 VIEW COMPARISON:  None. FINDINGS: The cortical margins of the right and left ribs are intact. No evidence of fracture. The bones are diffusely under mineralized. There is no evidence of pneumothorax or pleural effusion. Both lungs are clear. The heart is normal in size. There is mild aortic tortuosity, mediastinal contours otherwise normal. IMPRESSION: 1. No evidence of right or left rib fracture. 2. No acute chest findings. Electronically Signed   By: Narda Rutherford M.D.   On: 07/21/2021 17:03   DG Ribs Unilateral W/Chest Right  Result Date: 07/21/2021 CLINICAL DATA:  Trip and fall with bilateral rib pain. EXAM: RIGHT RIBS AND CHEST - 3+ VIEW; LEFT RIBS - 2 VIEW COMPARISON:  None. FINDINGS: The cortical margins of the right and left ribs are intact. No evidence of fracture. The bones are diffusely under mineralized. There is no evidence of pneumothorax or pleural effusion. Both lungs are clear. The heart is normal in size. There is mild aortic tortuosity, mediastinal contours otherwise normal. IMPRESSION: 1. No evidence of right or left rib fracture. 2. No acute chest findings. Electronically Signed   By: Narda Rutherford M.D.   On: 07/21/2021 17:03    Procedures Procedures (including critical care time)  Medications Ordered in UC Medications - No data to display  Initial Impression /  Assessment and Plan / UC Course  I have reviewed the triage vital signs and the nursing notes.  Pertinent labs & imaging results that were available during  my care of the patient were reviewed by me and considered in my medical decision making (see chart for details).     Results of rib x-ray reviewed with patient.  Patient assured no ribs were broken due to her fall.  Conservative care recommended including Tylenol, heat and/or ice to the areas of tenderness.  All questions addressed. Final Clinical Impressions(s) / UC Diagnoses   Final diagnoses:  Pain  Fall   Discharge Instructions   None    ED Prescriptions   None    PDMP not reviewed this encounter.   Theadora Rama, PA-C 07/22/21 [phone]

## 2021-07-21 NOTE — Discharge Instructions (Addendum)
Fortunately you have no broken ribs.  I feel that is most likely that you have bruised ribs on both sides from your fall.  There is no definitive treatment for this other than to maintain comfort with Tylenol up to 3000 mg daily, location of ice, heat or alternating between ice and heat.  Should you develop worsening shortness of breath please report to the emergency room immediately.

## 2021-07-21 NOTE — ED Triage Notes (Signed)
On Friday Pt fell after tripping on uneven pavement and hit her Lt side. Pt has pain to Lt rib area.

## 2021-08-19 DIAGNOSIS — L298 Other pruritus: Secondary | ICD-10-CM | POA: Diagnosis not present

## 2021-08-19 DIAGNOSIS — L2089 Other atopic dermatitis: Secondary | ICD-10-CM | POA: Diagnosis not present

## 2021-12-19 DIAGNOSIS — R059 Cough, unspecified: Secondary | ICD-10-CM | POA: Diagnosis not present

## 2021-12-19 DIAGNOSIS — Z20828 Contact with and (suspected) exposure to other viral communicable diseases: Secondary | ICD-10-CM | POA: Diagnosis not present

## 2021-12-23 DIAGNOSIS — I10 Essential (primary) hypertension: Secondary | ICD-10-CM | POA: Diagnosis not present

## 2021-12-23 DIAGNOSIS — U071 COVID-19: Secondary | ICD-10-CM | POA: Diagnosis not present

## 2021-12-23 DIAGNOSIS — Z20822 Contact with and (suspected) exposure to covid-19: Secondary | ICD-10-CM | POA: Diagnosis not present

## 2022-02-14 DIAGNOSIS — Z79899 Other long term (current) drug therapy: Secondary | ICD-10-CM | POA: Diagnosis not present

## 2022-02-14 DIAGNOSIS — I10 Essential (primary) hypertension: Secondary | ICD-10-CM | POA: Diagnosis not present

## 2022-02-14 DIAGNOSIS — M545 Low back pain, unspecified: Secondary | ICD-10-CM | POA: Diagnosis not present

## 2022-02-14 DIAGNOSIS — M5430 Sciatica, unspecified side: Secondary | ICD-10-CM | POA: Diagnosis not present

## 2022-02-17 DIAGNOSIS — M25552 Pain in left hip: Secondary | ICD-10-CM | POA: Diagnosis not present

## 2022-02-17 DIAGNOSIS — M545 Low back pain, unspecified: Secondary | ICD-10-CM | POA: Diagnosis not present

## 2022-02-17 DIAGNOSIS — M25551 Pain in right hip: Secondary | ICD-10-CM | POA: Diagnosis not present

## 2022-02-18 DIAGNOSIS — M5416 Radiculopathy, lumbar region: Secondary | ICD-10-CM | POA: Diagnosis not present

## 2022-03-06 ENCOUNTER — Telehealth: Payer: Self-pay | Admitting: Family Medicine

## 2022-03-06 DIAGNOSIS — I10 Essential (primary) hypertension: Secondary | ICD-10-CM

## 2022-03-06 DIAGNOSIS — E782 Mixed hyperlipidemia: Secondary | ICD-10-CM

## 2022-03-06 NOTE — Telephone Encounter (Signed)
Patient called to reschedule her physical with Dr [place] til June 14th. Patient would like to come into office for her labs prior to the appointment so that she can go over the results during her appointment. ? ?Patient would like to know if this can be done.  ?

## 2022-03-07 DIAGNOSIS — M5416 Radiculopathy, lumbar region: Secondary | ICD-10-CM | POA: Diagnosis not present

## 2022-03-07 NOTE — Telephone Encounter (Signed)
Orders are in, can be scheduled within a week of CPE. ?

## 2022-03-11 DIAGNOSIS — H02831 Dermatochalasis of right upper eyelid: Secondary | ICD-10-CM | POA: Diagnosis not present

## 2022-03-11 DIAGNOSIS — H15833 Staphyloma posticum, bilateral: Secondary | ICD-10-CM | POA: Diagnosis not present

## 2022-03-11 DIAGNOSIS — H04123 Dry eye syndrome of bilateral lacrimal glands: Secondary | ICD-10-CM | POA: Diagnosis not present

## 2022-03-11 DIAGNOSIS — Z961 Presence of intraocular lens: Secondary | ICD-10-CM | POA: Diagnosis not present

## 2022-03-13 DIAGNOSIS — L84 Corns and callosities: Secondary | ICD-10-CM | POA: Diagnosis not present

## 2022-03-13 DIAGNOSIS — L818 Other specified disorders of pigmentation: Secondary | ICD-10-CM | POA: Diagnosis not present

## 2022-03-13 DIAGNOSIS — R233 Spontaneous ecchymoses: Secondary | ICD-10-CM | POA: Diagnosis not present

## 2022-03-13 DIAGNOSIS — L648 Other androgenic alopecia: Secondary | ICD-10-CM | POA: Diagnosis not present

## 2022-03-13 DIAGNOSIS — L298 Other pruritus: Secondary | ICD-10-CM | POA: Diagnosis not present

## 2022-03-13 DIAGNOSIS — L2089 Other atopic dermatitis: Secondary | ICD-10-CM | POA: Diagnosis not present

## 2022-03-17 ENCOUNTER — Encounter: Payer: Medicare PPO | Admitting: Family Medicine

## 2022-04-30 ENCOUNTER — Encounter: Payer: Medicare PPO | Admitting: Family Medicine

## 2022-05-07 ENCOUNTER — Other Ambulatory Visit: Payer: Medicare PPO

## 2022-05-14 ENCOUNTER — Encounter: Payer: Medicare PPO | Admitting: Family Medicine

## 2022-06-04 ENCOUNTER — Other Ambulatory Visit (INDEPENDENT_AMBULATORY_CARE_PROVIDER_SITE_OTHER): Payer: Medicare PPO

## 2022-06-04 DIAGNOSIS — I10 Essential (primary) hypertension: Secondary | ICD-10-CM

## 2022-06-04 DIAGNOSIS — E782 Mixed hyperlipidemia: Secondary | ICD-10-CM

## 2022-06-04 LAB — TSH: TSH: 1.62 u[IU]/mL (ref 0.35–5.50)

## 2022-06-04 LAB — CBC WITH DIFFERENTIAL/PLATELET
Basophils Absolute: 0 10*3/uL (ref 0.0–0.1)
Basophils Relative: 0.5 % (ref 0.0–3.0)
Eosinophils Absolute: 0.1 10*3/uL (ref 0.0–0.7)
Eosinophils Relative: 1.5 % (ref 0.0–5.0)
HCT: 45.2 % (ref 36.0–46.0)
Hemoglobin: 15.1 g/dL — ABNORMAL HIGH (ref 12.0–15.0)
Lymphocytes Relative: 25.1 % (ref 12.0–46.0)
Lymphs Abs: 1.5 10*3/uL (ref 0.7–4.0)
MCHC: 33.4 g/dL (ref 30.0–36.0)
MCV: 95.3 fl (ref 78.0–100.0)
Monocytes Absolute: 0.6 10*3/uL (ref 0.1–1.0)
Monocytes Relative: 10 % (ref 3.0–12.0)
Neutro Abs: 3.7 10*3/uL (ref 1.4–7.7)
Neutrophils Relative %: 62.9 % (ref 43.0–77.0)
Platelets: 250 10*3/uL (ref 150.0–400.0)
RBC: 4.74 Mil/uL (ref 3.87–5.11)
RDW: 13.1 % (ref 11.5–15.5)
WBC: 5.8 10*3/uL (ref 4.0–10.5)

## 2022-06-04 LAB — LIPID PANEL
Cholesterol: 217 mg/dL — ABNORMAL HIGH (ref 0–200)
HDL: 49.4 mg/dL (ref >39.00)
LDL Cholesterol: 141 mg/dL — ABNORMAL HIGH (ref 0–99)
NonHDL: 167.9
Total CHOL/HDL Ratio: 4
Triglycerides: 133 mg/dL (ref 0.0–149.0)
VLDL: 26.6 mg/dL (ref 0.0–40.0)

## 2022-06-04 LAB — BASIC METABOLIC PANEL
BUN: 18 mg/dL (ref 6–23)
CO2: 28 mEq/L (ref 19–32)
Calcium: 10.1 mg/dL (ref 8.4–10.5)
Chloride: 105 mEq/L (ref 96–112)
Creatinine, Ser: 0.64 mg/dL (ref 0.40–1.20)
GFR: 82.59 mL/min (ref >60.00)
Glucose, Bld: 83 mg/dL (ref 70–99)
Potassium: 4 mEq/L (ref 3.5–5.1)
Sodium: 141 mEq/L (ref 135–145)

## 2022-06-04 NOTE — Progress Notes (Unsigned)
HPI: [name] is a 81 y.o. female, who is here today for her routine physical.  Last CPE: 06/26/2020.  She is exercising regularly, walks daily and water aerobics 2-3 times per week. She decreased carbs intake, stopped dairy products and red meat. She has lost about 22 Lb since her last CPE and feels great.  Chronic medical problems: HTN,HLD,generalized OA, and eczema among some. Follows with dermatologist.  Immunization History  Administered Date(s) Administered   Fluad Quad(high Dose 65+) 07/14/2019, 08/01/2020   Influenza Split 07/27/2012   Influenza Whole 08/27/2007, 07/11/2010, 07/24/2011, 07/24/2011   Influenza, High Dose Seasonal PF 08/09/2015, 07/22/2016, 08/13/2017, 07/18/2018, 08/13/2021   Influenza,inj,Quad PF,6+ Mos 07/28/2013, 08/17/2014   Influenza-Unspecified 07/18/2019, 08/08/2021   PFIZER(Purple Top)SARS-COV-2 Vaccination 12/09/2019, 12/27/2019, 08/09/2020   Pfizer Covid-19 Vaccine Bivalent Booster 60yrs & up 08/04/2021   Pneumococcal Conjugate-13 08/17/2014   Pneumococcal Polysaccharide-23 07/06/2009   Td 07/06/2009   Tdap 06/14/2020   Zoster Recombinat (Shingrix) 07/16/2021, 09/24/2021   Zoster, Live 07/11/2010   Health Maintenance  Topic Date Due   INFLUENZA VACCINE  06/17/2022   TETANUS/TDAP  06/14/2030   Pneumonia Vaccine 29+ Years old  Completed   COVID-19 Vaccine  Completed   Zoster Vaccines- Shingrix  Completed   HPV VACCINES  Aged Out   DEXA SCAN  Discontinued  She does not want more mammograms or DEXA.  She had labs done recently. HLD on non pharmacologic treatment.  Lab Results  Component Value Date   CHOL 217 (H) 06/04/2022   HDL 49.40 06/04/2022   LDLCALC 141 (H) 06/04/2022   LDLDIRECT 160.0 03/30/2015   TRIG 133.0 06/04/2022   CHOLHDL 4 06/04/2022   HTN on HCTZ 25 mg daily. Tolerating medication well, no side effects. Lab Results  Component Value Date   CREATININE 0.64 06/04/2022   BUN 18 06/04/2022   NA 141  06/04/2022   K 4.0 06/04/2022   CL 105 06/04/2022   CO2 28 06/04/2022   Lab Results  Component Value Date   TSH 1.62 06/04/2022   Lab Results  Component Value Date   WBC 5.8 06/04/2022   HGB 15.1 (H) 06/04/2022   HCT 45.2 06/04/2022   MCV 95.3 06/04/2022   PLT 250.0 06/04/2022   Fell in 07/2021, no serious harm.Evaluated in the ED on 07/21/21. Right knee OA, following with ortho, received knee injection and has helped. Back pain: Epidural lumbar injection, 02/2022.  Ecchymosis on forearms and occasionally on LE's, frequently with no hx of trauma. States that she discussed this with her dermatologist and she was reassured. Negative for nose/gum bleeding,gross hematuria,or blood in stool.  Review of Systems  Constitutional:  Negative for activity change, appetite change and fever.  HENT:  Negative for hearing loss, mouth sores and sore throat.   Eyes:  Negative for redness and visual disturbance.  Respiratory:  Negative for cough, shortness of breath and wheezing.   Cardiovascular:  Negative for chest pain, palpitations and leg swelling.  Gastrointestinal:  Negative for abdominal pain, nausea and vomiting.       No changes in bowel habits.  Endocrine: Negative for cold intolerance, heat intolerance, polydipsia, polyphagia and polyuria.  Genitourinary:  Negative for decreased urine volume, dysuria, vaginal bleeding and vaginal discharge.  Musculoskeletal:  Positive for arthralgias. Negative for gait problem.  Skin:  Positive for rash. Negative for color change.  Allergic/Immunologic: Positive for environmental allergies.  Neurological:  Negative for seizures, syncope, weakness and headaches.  Hematological:  Negative for adenopathy. Does not  bruise/bleed easily.  Psychiatric/Behavioral:  Negative for confusion. The patient is not nervous/anxious.   All other systems reviewed and are negative.  Current Outpatient Medications on File Prior to Visit  Medication Sig Dispense Refill    Biotin w/ Vitamins C & E (HAIR/SKIN/NAILS PO) Take 1 tablet by mouth daily.     Multiple Vitamin (MULTIVITAMIN WITH MINERALS) TABS tablet Take 1 tablet by mouth daily.     triamcinolone cream (KENALOG) 0.1 % Apply 1 Application topically as needed.     No current facility-administered medications on file prior to visit.   Past Medical History:  Diagnosis Date   CHEST WALL PAIN, ANTERIOR 07/26/2008   HYPERLIPIDEMIA 06/01/2008   HYPERTENSION 07/28/2007   MENOPAUSAL SYNDROME 06/01/2008   Past Surgical History:  Procedure Laterality Date   COLONOSCOPY     NO PAST SURGERIES     TOTAL HIP ARTHROPLASTY Right 07/04/2014   Procedure: RIGHT TOTAL HIP ARTHROPLASTY ANTERIOR APPROACH;  Surgeon: Sheral Apley, MD;  Location: [facility];  Service: Orthopedics;  Laterality: Right;   No Known Allergies  Family History  Problem Relation Age of Onset   Cancer Mother        lung ca   Heart disease Father    Social History   Socioeconomic History   Marital status: Married    Spouse name: Not on file   Number of children: Not on file   Years of education: Not on file   Highest education level: Not on file  Occupational History   Not on file  Tobacco Use   Smoking status: Never   Smokeless tobacco: Never  Substance and Sexual Activity   Alcohol use: Yes    Comment: rare   Drug use: No   Sexual activity: Not on file  Other Topics Concern   Not on file  Social History Narrative   Not on file   Social Determinants of Health   Financial Resource Strain: Not on file  Food Insecurity: Not on file  Transportation Needs: Not on file  Physical Activity: Not on file  Stress: Not on file  Social Connections: Not on file   Vitals:   06/06/22 0920  BP: 120/70  Pulse: 78  Resp: 16  SpO2: 98%  Body mass index is 21.66 kg/m. Wt Readings from Last 3 Encounters:  06/06/22 122 lb 4 oz (55.5 kg)  07/15/21 140 lb (63.5 kg)  03/04/21 144 lb 8 oz (65.5 kg)   Physical Exam Vitals and nursing  note reviewed.  Constitutional:      General: She is not in acute distress.    Appearance: She is well-developed and well-groomed.  HENT:     Head: Normocephalic and atraumatic.     Right Ear: Tympanic membrane and external ear normal.     Left Ear: Tympanic membrane, ear canal and external ear normal.     Ears:     Comments: Right ear canal scaly skin, no erythema or tenderness.     Mouth/Throat:     Mouth: Mucous membranes are moist.     Pharynx: Oropharynx is clear.  Eyes:     Extraocular Movements: Extraocular movements intact.     Conjunctiva/sclera: Conjunctivae normal.     Pupils: Pupils are equal, round, and reactive to light.  Neck:     Thyroid: No thyroid mass.     Trachea: No tracheal deviation.  Cardiovascular:     Rate and Rhythm: Normal rate and regular rhythm.     Pulses:  Dorsalis pedis pulses are 2+ on the right side and 2+ on the left side.     Heart sounds: No murmur heard.    Comments: Varicose veins LE,bilateral. Pulmonary:     Effort: Pulmonary effort is normal. No respiratory distress.     Breath sounds: Normal breath sounds.  Abdominal:     Palpations: Abdomen is soft. There is no hepatomegaly or mass.     Tenderness: There is no abdominal tenderness.  Genitourinary:    Comments: No concerns. Musculoskeletal:     Comments: No signs of synovitis appreciated.  Lymphadenopathy:     Cervical: No cervical adenopathy.  Skin:    General: Skin is warm.     Findings: Ecchymosis (One on medial aspect of right calf and a few scattered on forearms.) present. No erythema or rash.  Neurological:     General: No focal deficit present.     Mental Status: She is alert and oriented to person, place, and time.     Cranial Nerves: No cranial nerve deficit.     Coordination: Coordination normal.     Gait: Gait normal.     Deep Tendon Reflexes:     Reflex Scores:      Bicep reflexes are 2+ on the right side and 2+ on the left side.      Patellar reflexes  are 2+ on the right side and 2+ on the left side. Psychiatric:        Mood and Affect: Affect normal.   ASSESSMENT AND PLAN:  Ms. Kayla [name] was here today annual physical examination.  Diagnoses and all orders for this visit:  Routine general medical examination at a health care facility We discussed the importance of regular physical activity and healthy diet for prevention of chronic illness and/or complications. Preventive guidelines reviewed. Vaccination up to date. Ca++ and vit D supplementation to continue. Next CPE in a year.  Senile ecchymosis Educated about Dx. Reassured.  Hyperlipidemia, mixed She prefers to avoid adding medications. Continue low fat diet.  Essential hypertension BP adequately controlled. Continue current management: HCTZ 25 mg daily. DASH/low salt diet to continue. Monitor BP at home. Eye exam is current.  Return in 1 year (on 06/07/2023).  Xhaiden Coombs G. [place], MD  [facility]. [place] office.

## 2022-06-06 ENCOUNTER — Ambulatory Visit (INDEPENDENT_AMBULATORY_CARE_PROVIDER_SITE_OTHER): Payer: Medicare PPO | Admitting: Family Medicine

## 2022-06-06 ENCOUNTER — Encounter: Payer: Self-pay | Admitting: Family Medicine

## 2022-06-06 VITALS — BP 120/70 | HR 78 | Resp 16 | Ht 63.0 in | Wt 122.2 lb

## 2022-06-06 DIAGNOSIS — E782 Mixed hyperlipidemia: Secondary | ICD-10-CM | POA: Diagnosis not present

## 2022-06-06 DIAGNOSIS — Z0000 Encounter for general adult medical examination without abnormal findings: Secondary | ICD-10-CM

## 2022-06-06 DIAGNOSIS — R233 Spontaneous ecchymoses: Secondary | ICD-10-CM | POA: Diagnosis not present

## 2022-06-06 DIAGNOSIS — I10 Essential (primary) hypertension: Secondary | ICD-10-CM | POA: Diagnosis not present

## 2022-06-06 MED ORDER — HYDROCHLOROTHIAZIDE 12.5 MG PO TABS: 12.5000 mg | ORAL_TABLET | Freq: Every day | ORAL | 3 refills | Status: DC

## 2022-06-06 NOTE — Assessment & Plan Note (Signed)
She prefers to avoid adding medications. Continue low fat diet.

## 2022-06-06 NOTE — Assessment & Plan Note (Signed)
BP adequately controlled. Continue current management: HCTZ 25 mg daily. DASH/low salt diet to continue. Monitor BP at home. Eye exam is current.

## 2022-06-06 NOTE — Patient Instructions (Addendum)
A few things to remember from today's visit:  Routine general medical examination at a health care facility  Hyperlipidemia, mixed  Essential hypertension - Plan: hydrochlorothiazide (HYDRODIURIL) 12.5 MG tablet  Senile ecchymosis  If you need refills please call your pharmacy. Do not use My Chart to request refills or for acute issues that need immediate attention.   Please be sure medication list is accurate. If a new problem present, please set up appointment sooner than planned today.  Preventive Care 41 Years and Older, Female Preventive care refers to lifestyle choices and visits with your health care provider that can promote health and wellness. Preventive care visits are also called wellness exams. What can I expect for my preventive care visit? Counseling Your health care provider may ask you questions about your: Medical history, including: Past medical problems. Family medical history. Pregnancy and menstrual history. History of falls. Current health, including: Memory and ability to understand (cognition). Emotional well-being. Home life and relationship well-being. Sexual activity and sexual health. Lifestyle, including: Alcohol, nicotine or tobacco, and drug use. Access to firearms. Diet, exercise, and sleep habits. Work and work Statistician. Sunscreen use. Safety issues such as seatbelt and bike helmet use. Physical exam Your health care provider will check your: Height and weight. These may be used to calculate your BMI (body mass index). BMI is a measurement that tells if you are at a healthy weight. Waist circumference. This measures the distance around your waistline. This measurement also tells if you are at a healthy weight and may help predict your risk of certain diseases, such as type 2 diabetes and high blood pressure. Heart rate and blood pressure. Body temperature. Skin for abnormal spots. What immunizations do I need?  Vaccines are usually  given at various ages, according to a schedule. Your health care provider will recommend vaccines for you based on your age, medical history, and lifestyle or other factors, such as travel or where you work. What tests do I need? Screening Your health care provider may recommend screening tests for certain conditions. This may include: Lipid and cholesterol levels. Hepatitis C test. Hepatitis B test. HIV (human immunodeficiency virus) test. STI (sexually transmitted infection) testing, if you are at risk. Lung cancer screening. Colorectal cancer screening. Diabetes screening. This is done by checking your blood sugar (glucose) after you have not eaten for a while (fasting). Mammogram. Talk with your health care provider about how often you should have regular mammograms. BRCA-related cancer screening. This may be done if you have a family history of breast, ovarian, tubal, or peritoneal cancers. Bone density scan. This is done to screen for osteoporosis. Talk with your health care provider about your test results, treatment options, and if necessary, the need for more tests. Follow these instructions at home: Eating and drinking  Eat a diet that includes fresh fruits and vegetables, whole grains, lean protein, and low-fat dairy products. Limit your intake of foods with high amounts of sugar, saturated fats, and salt. Take vitamin and mineral supplements as recommended by your health care provider. Do not drink alcohol if your health care provider tells you not to drink. If you drink alcohol: Limit how much you have to 0-1 drink a day. Know how much alcohol is in your drink. In the [place] one drink equals one 12 oz bottle of beer (355 mL), one 5 oz glass of wine (148 mL), or one 1 oz glass of hard liquor (44 mL). Lifestyle Brush your teeth every morning and night with  fluoride toothpaste. Floss one time each day. Exercise for at least 30 minutes 5 or more days each week. Do not use any  products that contain nicotine or tobacco. These products include cigarettes, chewing tobacco, and vaping devices, such as e-cigarettes. If you need help quitting, ask your health care provider. Do not use drugs. If you are sexually active, practice safe sex. Use a condom or other form of protection in order to prevent STIs. Take aspirin only as told by your health care provider. Make sure that you understand how much to take and what form to take. Work with your health care provider to find out whether it is safe and beneficial for you to take aspirin daily. Ask your health care provider if you need to take a cholesterol-lowering medicine (statin). Find healthy ways to manage stress, such as: Meditation, yoga, or listening to music. Journaling. Talking to a trusted person. Spending time with friends and family. Minimize exposure to UV radiation to reduce your risk of skin cancer. Safety Always wear your seat belt while driving or riding in a vehicle. Do not drive: If you have been drinking alcohol. Do not ride with someone who has been drinking. When you are tired or distracted. While texting. If you have been using any mind-altering substances or drugs. Wear a helmet and other protective equipment during sports activities. If you have firearms in your house, make sure you follow all gun safety procedures. What's next? Visit your health care provider once a year for an annual wellness visit. Ask your health care provider how often you should have your eyes and teeth checked. Stay up to date on all vaccines. This information is not intended to replace advice given to you by your health care provider. Make sure you discuss any questions you have with your health care provider. Document Revised: 05/01/2021 Document Reviewed: 05/01/2021 Elsevier Patient Education  [employer].

## 2022-07-14 ENCOUNTER — Telehealth: Payer: Self-pay | Admitting: Family Medicine

## 2022-07-14 NOTE — Telephone Encounter (Signed)
Spoke with patient to schedule Medicare Annual Wellness Visit (AWV) either virtually or in office. Left  my Zachery Conch number [phone]  She stated she wcb to schedule   Last AWV ;07/15/21  please schedule at anytime with [facility] Nurse Health Advisor 1 or 2

## 2022-09-01 DIAGNOSIS — L2089 Other atopic dermatitis: Secondary | ICD-10-CM | POA: Diagnosis not present

## 2022-09-01 DIAGNOSIS — L239 Allergic contact dermatitis, unspecified cause: Secondary | ICD-10-CM | POA: Diagnosis not present

## 2022-09-01 DIAGNOSIS — L538 Other specified erythematous conditions: Secondary | ICD-10-CM | POA: Diagnosis not present

## 2022-09-01 DIAGNOSIS — L298 Other pruritus: Secondary | ICD-10-CM | POA: Diagnosis not present

## 2022-09-01 DIAGNOSIS — L820 Inflamed seborrheic keratosis: Secondary | ICD-10-CM | POA: Diagnosis not present

## 2022-09-02 ENCOUNTER — Telehealth: Payer: Self-pay | Admitting: Family Medicine

## 2022-09-02 NOTE — Telephone Encounter (Signed)
Pt asking if she should get the prevnar 20 and rsv immunization

## 2022-09-03 NOTE — Telephone Encounter (Signed)
I left patient a voicemail with the information below.

## 2022-09-03 NOTE — Telephone Encounter (Signed)
I do not think she needs Prevnar 20, she already had Pneumovax and Prevnar 13 vaccines in the past. She can have RSV vaccine. Thanks, BJ

## 2022-09-05 NOTE — Telephone Encounter (Signed)
I left patient a message with the information below.

## 2022-09-05 NOTE — Telephone Encounter (Signed)
Patient still questioning whether she needs the Prevnar 20. Wonders if it would hurt if she still got it

## 2022-09-05 NOTE — Telephone Encounter (Signed)
Because according to records she already had Prevnar 13 and Pneumovax vaccination I do not think she needs it but I do not think it will cause any harm to get it. Thanks, BJ

## 2022-10-22 ENCOUNTER — Telehealth: Payer: Self-pay | Admitting: Family Medicine

## 2022-10-22 NOTE — Telephone Encounter (Signed)
Spoke with patient to schedule Medicare Annual Wellness Visit (AWV) either virtually or in office.   She stated in passed Dr [place] did her AWV.  She wanted to have AWV with Dr [place]   Last AWV  07/15/21 please schedule with Nurse Health Adviser   45 min for awv-i and in office appointments 30 min for awv-s  phone/virtual appointments

## 2022-10-27 DIAGNOSIS — L239 Allergic contact dermatitis, unspecified cause: Secondary | ICD-10-CM | POA: Diagnosis not present

## 2022-10-30 DIAGNOSIS — L239 Allergic contact dermatitis, unspecified cause: Secondary | ICD-10-CM | POA: Diagnosis not present

## 2022-10-30 DIAGNOSIS — D692 Other nonthrombocytopenic purpura: Secondary | ICD-10-CM | POA: Diagnosis not present
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
## 2022-11-28 DIAGNOSIS — B078 Other viral warts: Secondary | ICD-10-CM | POA: Diagnosis not present

## 2022-11-28 DIAGNOSIS — L308 Other specified dermatitis: Secondary | ICD-10-CM | POA: Diagnosis not present

## 2022-11-28 DIAGNOSIS — L814 Other melanin hyperpigmentation: Secondary | ICD-10-CM | POA: Diagnosis not present

## 2022-11-28 DIAGNOSIS — L309 Dermatitis, unspecified: Secondary | ICD-10-CM | POA: Diagnosis not present

## 2022-11-28 DIAGNOSIS — R208 Other disturbances of skin sensation: Secondary | ICD-10-CM | POA: Diagnosis not present

## 2022-11-28 DIAGNOSIS — L538 Other specified erythematous conditions: Secondary | ICD-10-CM | POA: Diagnosis not present

## 2022-11-28 DIAGNOSIS — D1801 Hemangioma of skin and subcutaneous tissue: Secondary | ICD-10-CM | POA: Diagnosis not present

## 2022-11-28 DIAGNOSIS — L298 Other pruritus: Secondary | ICD-10-CM | POA: Diagnosis not present

## 2022-12-26 DIAGNOSIS — R208 Other disturbances of skin sensation: Secondary | ICD-10-CM | POA: Diagnosis not present

## 2022-12-26 DIAGNOSIS — D692 Other nonthrombocytopenic purpura: Secondary | ICD-10-CM | POA: Diagnosis not present

## 2022-12-26 DIAGNOSIS — B070 Plantar wart: Secondary | ICD-10-CM | POA: Diagnosis not present

## 2022-12-26 DIAGNOSIS — L538 Other specified erythematous conditions: Secondary | ICD-10-CM | POA: Diagnosis not present

## 2022-12-26 DIAGNOSIS — R238 Other skin changes: Secondary | ICD-10-CM | POA: Diagnosis not present

## 2022-12-26 DIAGNOSIS — L298 Other pruritus: Secondary | ICD-10-CM | POA: Diagnosis not present

## 2022-12-26 DIAGNOSIS — L308 Other specified dermatitis: Secondary | ICD-10-CM | POA: Diagnosis not present

## 2023-01-12 ENCOUNTER — Telehealth: Payer: Self-pay | Admitting: Family Medicine

## 2023-01-12 MED ORDER — MUPIROCIN 2 % EX OINT: 1.0000 | TOPICAL_OINTMENT | Freq: Two times a day (BID) | CUTANEOUS | 0 refills | Status: AC
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
## 2023-01-12 NOTE — Telephone Encounter (Signed)
Requesting an antibiotic for an abrasion on lower left leg. Declined OV as she is in [place]

## 2023-01-12 NOTE — Telephone Encounter (Signed)
Traumatic lesions in lower extremity can take some time to heal, it is hard to know if is a soft tissue infection or just irritation without an appropriate examination. She can use mupirocin ointment, prescription can be sent to pharmacy use twice daily for 10 days.  Apply antibiotic around ulcer, because will also slow down healing if applied on wound. The main thing is to keep area clean with soap and water. I will be glad to see her when she is back from [place], if problem gets worse she needs to seek immediate medical attention. Thanks, BJ

## 2023-01-12 NOTE — Addendum Note (Signed)
Addended by: Rodrigo Ran on: 01/12/2023 02:18 PM   Modules accepted: Orders

## 2023-01-12 NOTE — Telephone Encounter (Signed)
I spoke with patient. She is aware of information below. Rx sent in. She will let us know how the wound is doing & will set up f/u once she is back if it hasn't healed.

## 2023-01-23 DIAGNOSIS — L298 Other pruritus: Secondary | ICD-10-CM | POA: Diagnosis not present

## 2023-01-23 DIAGNOSIS — R208 Other disturbances of skin sensation: Secondary | ICD-10-CM | POA: Diagnosis not present

## 2023-01-23 DIAGNOSIS — B070 Plantar wart: Secondary | ICD-10-CM | POA: Diagnosis not present

## 2023-01-23 DIAGNOSIS — L538 Other specified erythematous conditions: Secondary | ICD-10-CM | POA: Diagnosis not present

## 2023-01-23 DIAGNOSIS — L308 Other specified dermatitis: Secondary | ICD-10-CM | POA: Diagnosis not present

## 2023-02-20 DIAGNOSIS — B070 Plantar wart: Secondary | ICD-10-CM | POA: Diagnosis not present

## 2023-02-20 DIAGNOSIS — L308 Other specified dermatitis: Secondary | ICD-10-CM | POA: Diagnosis not present

## 2023-02-20 DIAGNOSIS — L298 Other pruritus: Secondary | ICD-10-CM | POA: Diagnosis not present

## 2023-02-20 DIAGNOSIS — L538 Other specified erythematous conditions: Secondary | ICD-10-CM | POA: Diagnosis not present

## 2023-02-20 DIAGNOSIS — R208 Other disturbances of skin sensation: Secondary | ICD-10-CM | POA: Diagnosis not present

## 2023-02-20 DIAGNOSIS — L309 Dermatitis, unspecified: Secondary | ICD-10-CM | POA: Diagnosis not present

## 2023-02-25 DIAGNOSIS — S93402A Sprain of unspecified ligament of left ankle, initial encounter: Secondary | ICD-10-CM | POA: Diagnosis not present

## 2023-02-25 DIAGNOSIS — S92902A Unspecified fracture of left foot, initial encounter for closed fracture: Secondary | ICD-10-CM | POA: Diagnosis not present

## 2023-02-25 DIAGNOSIS — S93409A Sprain of unspecified ligament of unspecified ankle, initial encounter: Secondary | ICD-10-CM | POA: Diagnosis not present

## 2023-02-25 DIAGNOSIS — S92909A Unspecified fracture of unspecified foot, initial encounter for closed fracture: Secondary | ICD-10-CM | POA: Diagnosis not present

## 2023-03-09 DIAGNOSIS — S92355A Nondisplaced fracture of fifth metatarsal bone, left foot, initial encounter for closed fracture: Secondary | ICD-10-CM | POA: Diagnosis not present

## 2023-03-12 DIAGNOSIS — L2089 Other atopic dermatitis: Secondary | ICD-10-CM | POA: Diagnosis not present

## 2023-03-12 DIAGNOSIS — L821 Other seborrheic keratosis: Secondary | ICD-10-CM | POA: Diagnosis not present

## 2023-03-12 DIAGNOSIS — D225 Melanocytic nevi of trunk: Secondary | ICD-10-CM | POA: Diagnosis not present

## 2023-03-12 DIAGNOSIS — B078 Other viral warts: Secondary | ICD-10-CM | POA: Diagnosis not present

## 2023-03-12 DIAGNOSIS — L814 Other melanin hyperpigmentation: Secondary | ICD-10-CM | POA: Diagnosis not present

## 2023-04-28 ENCOUNTER — Encounter: Payer: Self-pay | Admitting: *Deleted

## 2023-04-28 NOTE — Progress Notes (Signed)
[facility] Quality Team Note  Name: Kayla [name] Date of Birth: 07-10-1941 MRN: [id number] Date: 04/28/2023  [facility] Quality Team has reviewed this patient's chart, please see recommendations below:  Dexa Scan Scheduling; Pt on [facility] report.  Fracture date 02/25/23 and [facility] deadline 08/24/2023.  Would need Dexa before deadline in order to close gap.  Pt has history of declining 2023.

## 2023-05-11 DIAGNOSIS — S92355D Nondisplaced fracture of fifth metatarsal bone, left foot, subsequent encounter for fracture with routine healing: Secondary | ICD-10-CM | POA: Diagnosis not present

## 2023-06-24 DIAGNOSIS — S92355D Nondisplaced fracture of fifth metatarsal bone, left foot, subsequent encounter for fracture with routine healing: Secondary | ICD-10-CM | POA: Diagnosis not present

## 2023-06-24 DIAGNOSIS — M1711 Unilateral primary osteoarthritis, right knee: Secondary | ICD-10-CM | POA: Diagnosis not present

## 2023-08-09 ENCOUNTER — Other Ambulatory Visit: Payer: Self-pay | Admitting: Family Medicine

## 2023-08-09 DIAGNOSIS — I10 Essential (primary) hypertension: Secondary | ICD-10-CM

## 2023-08-20 DIAGNOSIS — L853 Xerosis cutis: Secondary | ICD-10-CM | POA: Diagnosis not present

## 2023-08-20 DIAGNOSIS — R590 Localized enlarged lymph nodes: Secondary | ICD-10-CM | POA: Diagnosis not present

## 2023-08-20 DIAGNOSIS — E2839 Other primary ovarian failure: Secondary | ICD-10-CM | POA: Diagnosis not present

## 2023-08-20 DIAGNOSIS — Z1331 Encounter for screening for depression: Secondary | ICD-10-CM | POA: Diagnosis not present

## 2023-08-20 DIAGNOSIS — Z23 Encounter for immunization: Secondary | ICD-10-CM | POA: Diagnosis not present

## 2023-08-20 DIAGNOSIS — I10 Essential (primary) hypertension: Secondary | ICD-10-CM | POA: Diagnosis not present

## 2023-08-20 DIAGNOSIS — D692 Other nonthrombocytopenic purpura: Secondary | ICD-10-CM | POA: Diagnosis not present

## 2023-08-20 DIAGNOSIS — Z0000 Encounter for general adult medical examination without abnormal findings: Secondary | ICD-10-CM | POA: Diagnosis not present

## 2023-09-20 ENCOUNTER — Other Ambulatory Visit: Payer: Self-pay | Admitting: Family Medicine

## 2023-09-20 DIAGNOSIS — I10 Essential (primary) hypertension: Secondary | ICD-10-CM

## 2023-10-20 DIAGNOSIS — Z1231 Encounter for screening mammogram for malignant neoplasm of breast: Secondary | ICD-10-CM | POA: Diagnosis not present

## 2023-10-20 DIAGNOSIS — E2839 Other primary ovarian failure: Secondary | ICD-10-CM | POA: Diagnosis not present

## 2023-10-20 DIAGNOSIS — Z780 Asymptomatic menopausal state: Secondary | ICD-10-CM | POA: Diagnosis not present

## 2023-10-20 DIAGNOSIS — M810 Age-related osteoporosis without current pathological fracture: Secondary | ICD-10-CM | POA: Diagnosis not present

## 2023-10-20 LAB — HM DEXA SCAN

## 2023-10-21 DIAGNOSIS — K130 Diseases of lips: Secondary | ICD-10-CM | POA: Diagnosis not present

## 2023-10-21 DIAGNOSIS — L2989 Other pruritus: Secondary | ICD-10-CM | POA: Diagnosis not present

## 2023-10-21 DIAGNOSIS — L538 Other specified erythematous conditions: Secondary | ICD-10-CM | POA: Diagnosis not present

## 2023-10-21 DIAGNOSIS — L570 Actinic keratosis: Secondary | ICD-10-CM | POA: Diagnosis not present

## 2023-10-21 DIAGNOSIS — Q828 Other specified congenital malformations of skin: Secondary | ICD-10-CM | POA: Diagnosis not present

## 2023-10-21 DIAGNOSIS — D492 Neoplasm of unspecified behavior of bone, soft tissue, and skin: Secondary | ICD-10-CM | POA: Diagnosis not present

## 2023-10-21 DIAGNOSIS — L820 Inflamed seborrheic keratosis: Secondary | ICD-10-CM | POA: Diagnosis not present

## 2023-10-21 DIAGNOSIS — L821 Other seborrheic keratosis: Secondary | ICD-10-CM | POA: Diagnosis not present

## 2023-11-06 DIAGNOSIS — D692 Other nonthrombocytopenic purpura: Secondary | ICD-10-CM | POA: Diagnosis not present

## 2023-11-06 DIAGNOSIS — R928 Other abnormal and inconclusive findings on diagnostic imaging of breast: Secondary | ICD-10-CM | POA: Diagnosis not present

## 2023-11-06 DIAGNOSIS — I10 Essential (primary) hypertension: Secondary | ICD-10-CM | POA: Diagnosis not present

## 2023-11-06 DIAGNOSIS — R590 Localized enlarged lymph nodes: Secondary | ICD-10-CM | POA: Diagnosis not present

## 2023-11-06 DIAGNOSIS — M810 Age-related osteoporosis without current pathological fracture: Secondary | ICD-10-CM | POA: Diagnosis not present

## 2023-11-20 DIAGNOSIS — H0014 Chalazion left upper eyelid: Secondary | ICD-10-CM | POA: Diagnosis not present

## 2023-11-30 ENCOUNTER — Encounter: Payer: Self-pay | Admitting: Family Medicine

## 2024-03-14 DIAGNOSIS — M1711 Unilateral primary osteoarthritis, right knee: Secondary | ICD-10-CM | POA: Diagnosis not present

## 2024-03-15 DIAGNOSIS — R92321 Mammographic fibroglandular density, right breast: Secondary | ICD-10-CM | POA: Diagnosis not present

## 2024-03-15 DIAGNOSIS — R928 Other abnormal and inconclusive findings on diagnostic imaging of breast: Secondary | ICD-10-CM | POA: Diagnosis not present

## 2024-08-22 DIAGNOSIS — Z0000 Encounter for general adult medical examination without abnormal findings: Secondary | ICD-10-CM | POA: Diagnosis not present

## 2024-08-22 DIAGNOSIS — H9192 Unspecified hearing loss, left ear: Secondary | ICD-10-CM | POA: Diagnosis not present

## 2024-08-22 DIAGNOSIS — K219 Gastro-esophageal reflux disease without esophagitis: Secondary | ICD-10-CM | POA: Diagnosis not present

## 2024-08-22 DIAGNOSIS — R58 Hemorrhage, not elsewhere classified: Secondary | ICD-10-CM | POA: Diagnosis not present

## 2024-08-22 DIAGNOSIS — R590 Localized enlarged lymph nodes: Secondary | ICD-10-CM | POA: Diagnosis not present

## 2024-08-22 DIAGNOSIS — M810 Age-related osteoporosis without current pathological fracture: Secondary | ICD-10-CM | POA: Diagnosis not present

## 2024-08-22 DIAGNOSIS — I10 Essential (primary) hypertension: Secondary | ICD-10-CM | POA: Diagnosis not present
